# Patient Record
Sex: Female | Born: 1965 | Race: Black or African American | Hispanic: No | Marital: Married | Smoking: Never smoker
Health system: Southern US, Community
[De-identification: ages and names within clinical notes are randomized; demographics above are authoritative.]

## PROBLEM LIST (undated history)

## (undated) DIAGNOSIS — E669 Obesity, unspecified: Secondary | ICD-10-CM

## (undated) DIAGNOSIS — K5792 Diverticulitis of intestine, part unspecified, without perforation or abscess without bleeding: Secondary | ICD-10-CM

## (undated) DIAGNOSIS — R002 Palpitations: Secondary | ICD-10-CM

## (undated) DIAGNOSIS — I10 Essential (primary) hypertension: Secondary | ICD-10-CM

## (undated) DIAGNOSIS — E785 Hyperlipidemia, unspecified: Secondary | ICD-10-CM

## (undated) DIAGNOSIS — D573 Sickle-cell trait: Secondary | ICD-10-CM

## (undated) HISTORY — DX: Hyperlipidemia, unspecified: E78.5

## (undated) HISTORY — DX: Essential (primary) hypertension: I10

## (undated) HISTORY — PX: COLONOSCOPY: SHX174

## (undated) HISTORY — DX: Diverticulitis of intestine, part unspecified, without perforation or abscess without bleeding: K57.92

## (undated) HISTORY — PX: OTHER SURGICAL HISTORY: SHX169

## (undated) HISTORY — DX: Sickle-cell trait: D57.3

## (undated) HISTORY — DX: Obesity, unspecified: E66.9

## (undated) HISTORY — PX: POLYPECTOMY: SHX149

## (undated) HISTORY — DX: Palpitations: R00.2

---

## 1999-03-16 ENCOUNTER — Other Ambulatory Visit: Admission: RE | Admit: 1999-03-16 | Discharge: 1999-03-16 | Payer: Self-pay | Admitting: Obstetrics and Gynecology

## 1999-04-25 ENCOUNTER — Encounter: Payer: Self-pay | Admitting: Obstetrics and Gynecology

## 1999-04-25 ENCOUNTER — Ambulatory Visit (HOSPITAL_COMMUNITY): Admission: RE | Admit: 1999-04-25 | Discharge: 1999-04-25 | Payer: Self-pay | Admitting: Obstetrics and Gynecology

## 2000-04-10 ENCOUNTER — Other Ambulatory Visit: Admission: RE | Admit: 2000-04-10 | Discharge: 2000-04-10 | Payer: Self-pay | Admitting: Obstetrics and Gynecology

## 2001-02-11 ENCOUNTER — Other Ambulatory Visit: Admission: RE | Admit: 2001-02-11 | Discharge: 2001-02-11 | Payer: Self-pay | Admitting: Obstetrics and Gynecology

## 2002-03-04 ENCOUNTER — Other Ambulatory Visit: Admission: RE | Admit: 2002-03-04 | Discharge: 2002-03-04 | Payer: Self-pay | Admitting: Obstetrics and Gynecology

## 2003-04-28 ENCOUNTER — Emergency Department (HOSPITAL_COMMUNITY): Admission: AD | Admit: 2003-04-28 | Discharge: 2003-04-28 | Payer: Self-pay | Admitting: Emergency Medicine

## 2003-11-22 ENCOUNTER — Emergency Department (HOSPITAL_COMMUNITY): Admission: EM | Admit: 2003-11-22 | Discharge: 2003-11-23 | Payer: Self-pay | Admitting: Emergency Medicine

## 2004-06-29 ENCOUNTER — Other Ambulatory Visit: Admission: RE | Admit: 2004-06-29 | Discharge: 2004-06-29 | Payer: Self-pay | Admitting: Obstetrics & Gynecology

## 2004-07-21 ENCOUNTER — Ambulatory Visit (HOSPITAL_COMMUNITY): Admission: RE | Admit: 2004-07-21 | Discharge: 2004-07-21 | Payer: Self-pay | Admitting: Obstetrics & Gynecology

## 2006-06-17 ENCOUNTER — Ambulatory Visit: Payer: Self-pay | Admitting: Oncology

## 2006-09-17 ENCOUNTER — Ambulatory Visit: Payer: Self-pay | Admitting: Oncology

## 2007-03-10 ENCOUNTER — Ambulatory Visit (HOSPITAL_COMMUNITY): Admission: RE | Admit: 2007-03-10 | Discharge: 2007-03-10 | Payer: Self-pay | Admitting: Obstetrics & Gynecology

## 2007-06-16 ENCOUNTER — Ambulatory Visit (HOSPITAL_COMMUNITY): Admission: RE | Admit: 2007-06-16 | Discharge: 2007-06-16 | Payer: Self-pay | Admitting: Gynecology

## 2007-06-19 LAB — HM PAP SMEAR: HM Pap smear: NORMAL

## 2007-07-15 ENCOUNTER — Ambulatory Visit (HOSPITAL_COMMUNITY): Admission: RE | Admit: 2007-07-15 | Discharge: 2007-07-15 | Payer: Self-pay | Admitting: Gynecology

## 2007-10-03 ENCOUNTER — Ambulatory Visit: Payer: Self-pay | Admitting: Internal Medicine

## 2007-10-10 ENCOUNTER — Encounter: Payer: Self-pay | Admitting: Internal Medicine

## 2007-10-10 ENCOUNTER — Ambulatory Visit: Payer: Self-pay | Admitting: Internal Medicine

## 2008-04-26 ENCOUNTER — Ambulatory Visit (HOSPITAL_COMMUNITY): Admission: RE | Admit: 2008-04-26 | Discharge: 2008-04-26 | Payer: Self-pay | Admitting: Obstetrics and Gynecology

## 2009-01-11 ENCOUNTER — Emergency Department (HOSPITAL_COMMUNITY): Admission: EM | Admit: 2009-01-11 | Discharge: 2009-01-11 | Payer: Self-pay | Admitting: Family Medicine

## 2010-01-27 ENCOUNTER — Ambulatory Visit (HOSPITAL_COMMUNITY): Admission: RE | Admit: 2010-01-27 | Discharge: 2010-01-27 | Payer: Self-pay | Admitting: Obstetrics and Gynecology

## 2010-03-24 ENCOUNTER — Emergency Department (HOSPITAL_COMMUNITY): Admission: EM | Admit: 2010-03-24 | Discharge: 2010-03-24 | Payer: Self-pay | Admitting: Family Medicine

## 2010-06-14 ENCOUNTER — Emergency Department (HOSPITAL_COMMUNITY)
Admission: EM | Admit: 2010-06-14 | Discharge: 2010-06-14 | Payer: Self-pay | Source: Home / Self Care | Admitting: Family Medicine

## 2010-09-15 ENCOUNTER — Encounter (INDEPENDENT_AMBULATORY_CARE_PROVIDER_SITE_OTHER): Payer: Self-pay | Admitting: *Deleted

## 2010-09-19 NOTE — Letter (Signed)
Summary: Pre Visit Letter Revised  [facility]  [address] [place], [place] [address]   Phone: [phone]  Fax: [phone]        09/15/2010 MRN: [id number] [facility] [address] [place], [place]  [address]             Procedure Date:  10/27/2010 @ 2;00   Recall colon-Dr. Marina Goodell   Welcome to the Gastroenterology Division at [facility].    You are scheduled to see a nurse for your pre-procedure visit on 10/13/2010 at 8:30_ on the 3rd floor at [facility], [address]  We ask that you try to arrive at our office 15 minutes prior to your appointment time to allow for check-in.  Please take a minute to review the attached form.  If you answer "Yes" to one or more of the questions on the first page, we ask that you call the person listed at your earliest opportunity.  If you answer "No" to all of the questions, please complete the rest of the form and bring it to your appointment.    Your nurse visit will consist of discussing your medical and surgical history, your immediate family medical history, and your medications.   If you are unable to list all of your medications on the form, please bring the medication bottles to your appointment and we will list them.  We will need to be aware of both prescribed and over the counter drugs.  We will need to know exact dosage information as well.    Please be prepared to read and sign documents such as consent forms, a financial agreement, and acknowledgement forms.  If necessary, and with your consent, a friend or relative is welcome to sit-in on the nurse visit with you.  Please bring your insurance card so that we may make a copy of it.  If your insurance requires a referral to see a specialist, please bring your referral form from your primary care physician.  No co-pay is required for this nurse visit.     If you cannot keep your appointment, please call [phone] to cancel or reschedule prior  to your appointment date.  This allows [place] the opportunity to schedule an appointment for another patient in need of care.    Thank you for choosing [facility] for your medical needs.  We appreciate the opportunity to care for you.  Please visit [place] at our website  to learn more about our practice.  Sincerely, The Gastroenterology Division

## 2010-09-24 LAB — POCT PREGNANCY, URINE: Preg Test, Ur: NEGATIVE

## 2010-09-24 LAB — POCT I-STAT, CHEM 8
Calcium, Ion: 1.13 mmol/L (ref 1.12–1.32)
Chloride: 100 mEq/L (ref 96–112)
Glucose, Bld: 81 mg/dL (ref 70–99)

## 2010-10-13 ENCOUNTER — Ambulatory Visit (AMBULATORY_SURGERY_CENTER): Payer: Commercial Managed Care - PPO | Admitting: *Deleted

## 2010-10-13 ENCOUNTER — Encounter: Payer: Self-pay | Admitting: Internal Medicine

## 2010-10-13 DIAGNOSIS — Z800 Family history of malignant neoplasm of digestive organs: Secondary | ICD-10-CM

## 2010-10-13 DIAGNOSIS — Z860100 Personal history of colon polyps, unspecified: Secondary | ICD-10-CM

## 2010-10-13 MED ORDER — PEG-KCL-NACL-NASULF-NA ASC-C 100 G PO SOLR: ORAL | Status: DC

## 2010-10-27 ENCOUNTER — Ambulatory Visit (AMBULATORY_SURGERY_CENTER): Payer: Commercial Managed Care - PPO | Admitting: Internal Medicine

## 2010-10-27 ENCOUNTER — Encounter: Payer: Self-pay | Admitting: Internal Medicine

## 2010-10-27 VITALS — BP 149/69 | HR 92 | Temp 99.1°F | Resp 22 | Ht 65.0 in | Wt 280.0 lb

## 2010-10-27 DIAGNOSIS — Z86010 Personal history of colonic polyps: Secondary | ICD-10-CM

## 2010-10-27 DIAGNOSIS — Z800 Family history of malignant neoplasm of digestive organs: Secondary | ICD-10-CM

## 2010-10-27 DIAGNOSIS — Z1211 Encounter for screening for malignant neoplasm of colon: Secondary | ICD-10-CM

## 2010-10-27 DIAGNOSIS — K5730 Diverticulosis of large intestine without perforation or abscess without bleeding: Secondary | ICD-10-CM

## 2010-10-27 LAB — HM COLONOSCOPY

## 2010-10-27 MED ORDER — SODIUM CHLORIDE 0.9 % IV SOLN: 500.0000 mL | INTRAVENOUS | Status: DC

## 2010-10-27 NOTE — Patient Instructions (Signed)
Please follow discharge instructions given today. Diverticulosis seen,handouts given on that and high fiber diet. Repeat colonoscopy in 5 years. Continue current medications.

## 2010-10-30 ENCOUNTER — Telehealth: Payer: Self-pay | Admitting: *Deleted

## 2010-10-30 NOTE — Telephone Encounter (Signed)
Attempted home number which is the same as the mobile number and there was no identification on the number voice mail so no message left-em,rn

## 2011-03-10 ENCOUNTER — Emergency Department (HOSPITAL_COMMUNITY): Payer: 59

## 2011-03-10 ENCOUNTER — Emergency Department (HOSPITAL_COMMUNITY)
Admission: EM | Admit: 2011-03-10 | Discharge: 2011-03-10 | Disposition: A | Discharge status: Home / Self Care | Payer: 59 | Attending: Emergency Medicine | Admitting: Emergency Medicine

## 2011-03-10 DIAGNOSIS — T5491XA Toxic effect of unspecified corrosive substance, accidental (unintentional), initial encounter: Secondary | ICD-10-CM | POA: Insufficient documentation

## 2011-03-10 DIAGNOSIS — I10 Essential (primary) hypertension: Secondary | ICD-10-CM | POA: Insufficient documentation

## 2011-03-10 DIAGNOSIS — R0602 Shortness of breath: Secondary | ICD-10-CM | POA: Insufficient documentation

## 2011-03-10 DIAGNOSIS — T541X1A Toxic effect of other corrosive organic compounds, accidental (unintentional), initial encounter: Secondary | ICD-10-CM | POA: Insufficient documentation

## 2011-07-18 ENCOUNTER — Encounter: Payer: 59 | Attending: Family Medicine | Admitting: *Deleted

## 2011-07-18 ENCOUNTER — Encounter: Payer: Self-pay | Admitting: *Deleted

## 2011-07-18 NOTE — Progress Notes (Signed)
  Patient was seen on 07/18/2011 for the first of a series of three diabetes self-management courses at [facility]. The following learning objectives were met by the patient during this course:   Defines the role of glucose and insulin  Identifies type of diabetes and pathophysiology  Defines the diagnostic criteria for diabetes and prediabetes  States the risk factors for Type 2 Diabetes  States the symptoms of Type 2 Diabetes  Defines Type 2 Diabetes treatment goals  Defines Type 2 Diabetes treatment options  States the rationale for glucose monitoring  Identifies A1C, glucose targets, and testing times  Identifies proper sharps disposal  Defines the purpose of a diabetes food plan  Identifies carbohydrate food groups  Defines effects of carbohydrate foods on glucose levels  Identifies carbohydrate choices/grams/food labels  States benefits of physical activity and effect on glucose  Review of suggested activity guidelines  Last A1c: 8.2 (05/20/12)  Handouts given during class include:  Type 2 Diabetes: Basics Book  My Food Plan Book  Food and Activity Log  Patient has established the following initial goals:  Pt did not establish any goals at visit.  Follow-Up Plan: Pt to attend Core Diabetes Education classes II and III in March 2013 as scheduled.

## 2011-07-18 NOTE — Patient Instructions (Signed)
Patient will attend Core Diabetes Courses II and III as scheduled or follow up prn.  

## 2011-07-31 IMAGING — CR DG HAND COMPLETE 3+V*R*
3 series · 3 of 3 positions shown · non-contrast
Comparison: None.

CLINICAL DATA: Hand and arm pain, no acute trauma

RIGHT HAND - COMPLETE 3+ VIEW

[view not recorded (1 of 3)]
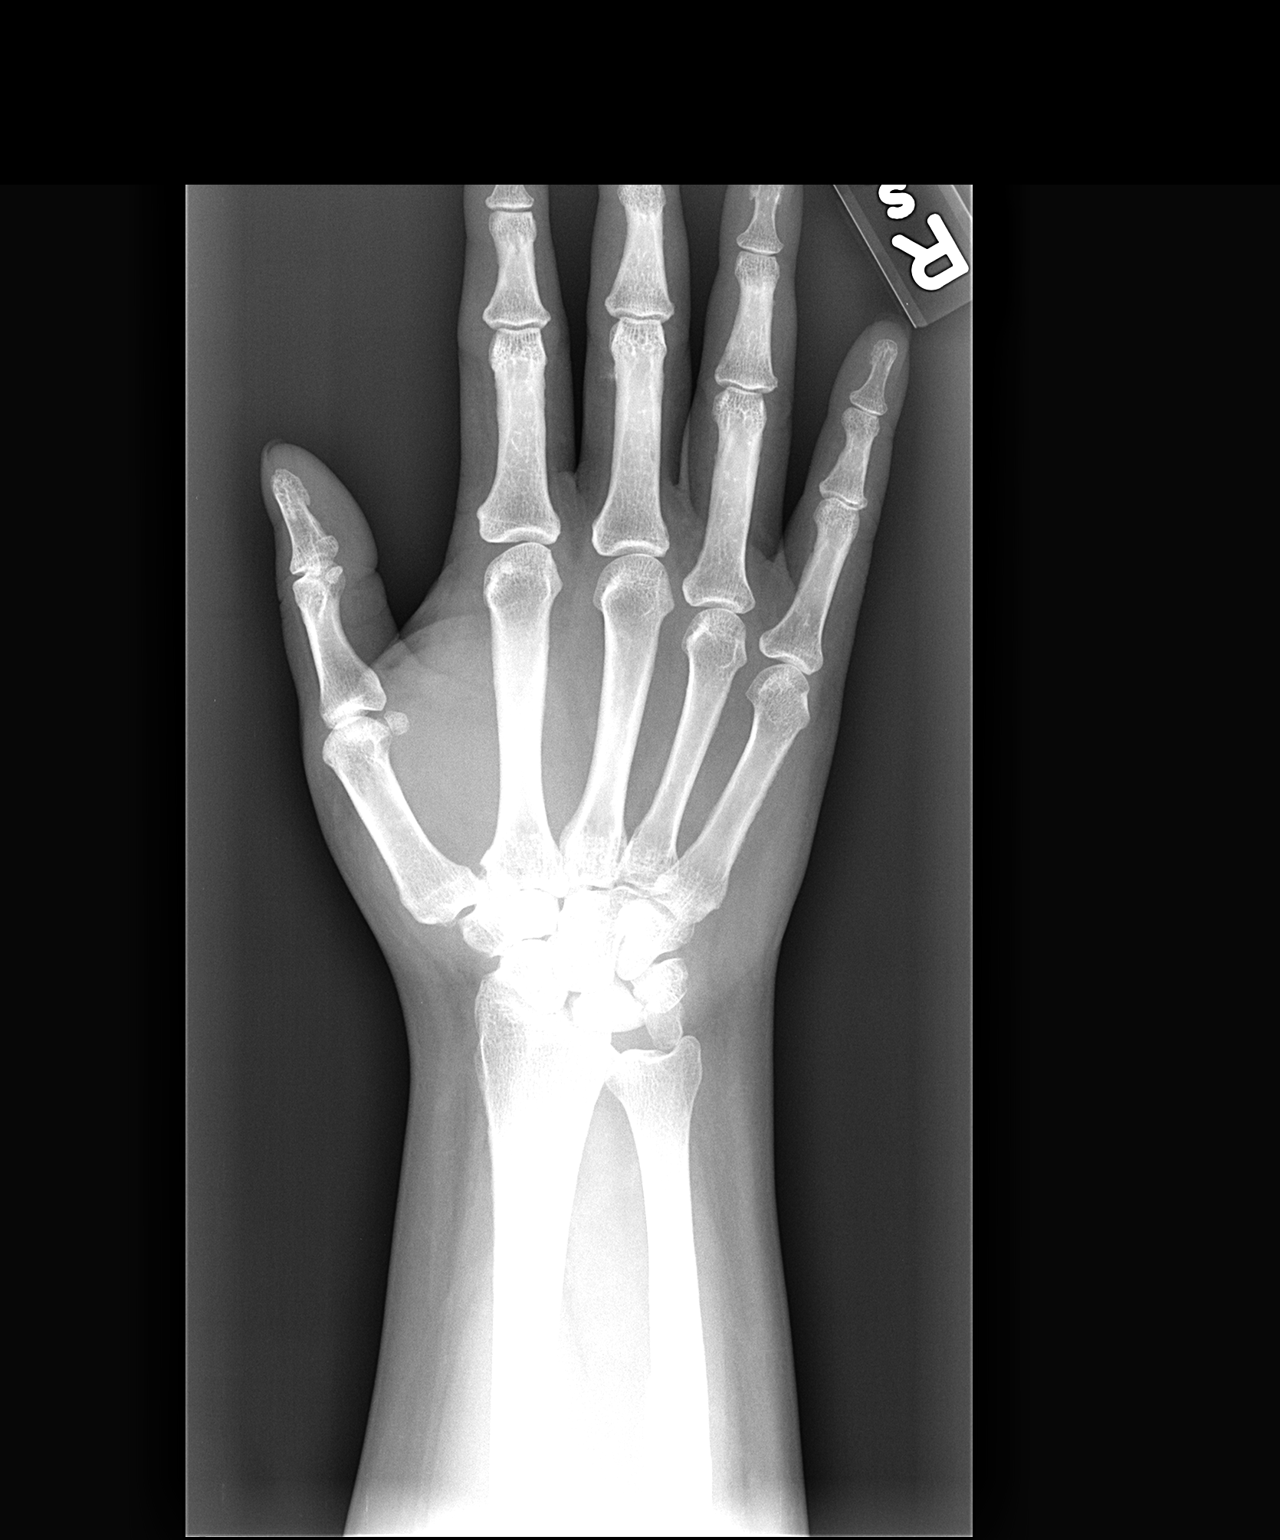

[view not recorded (2 of 3)]
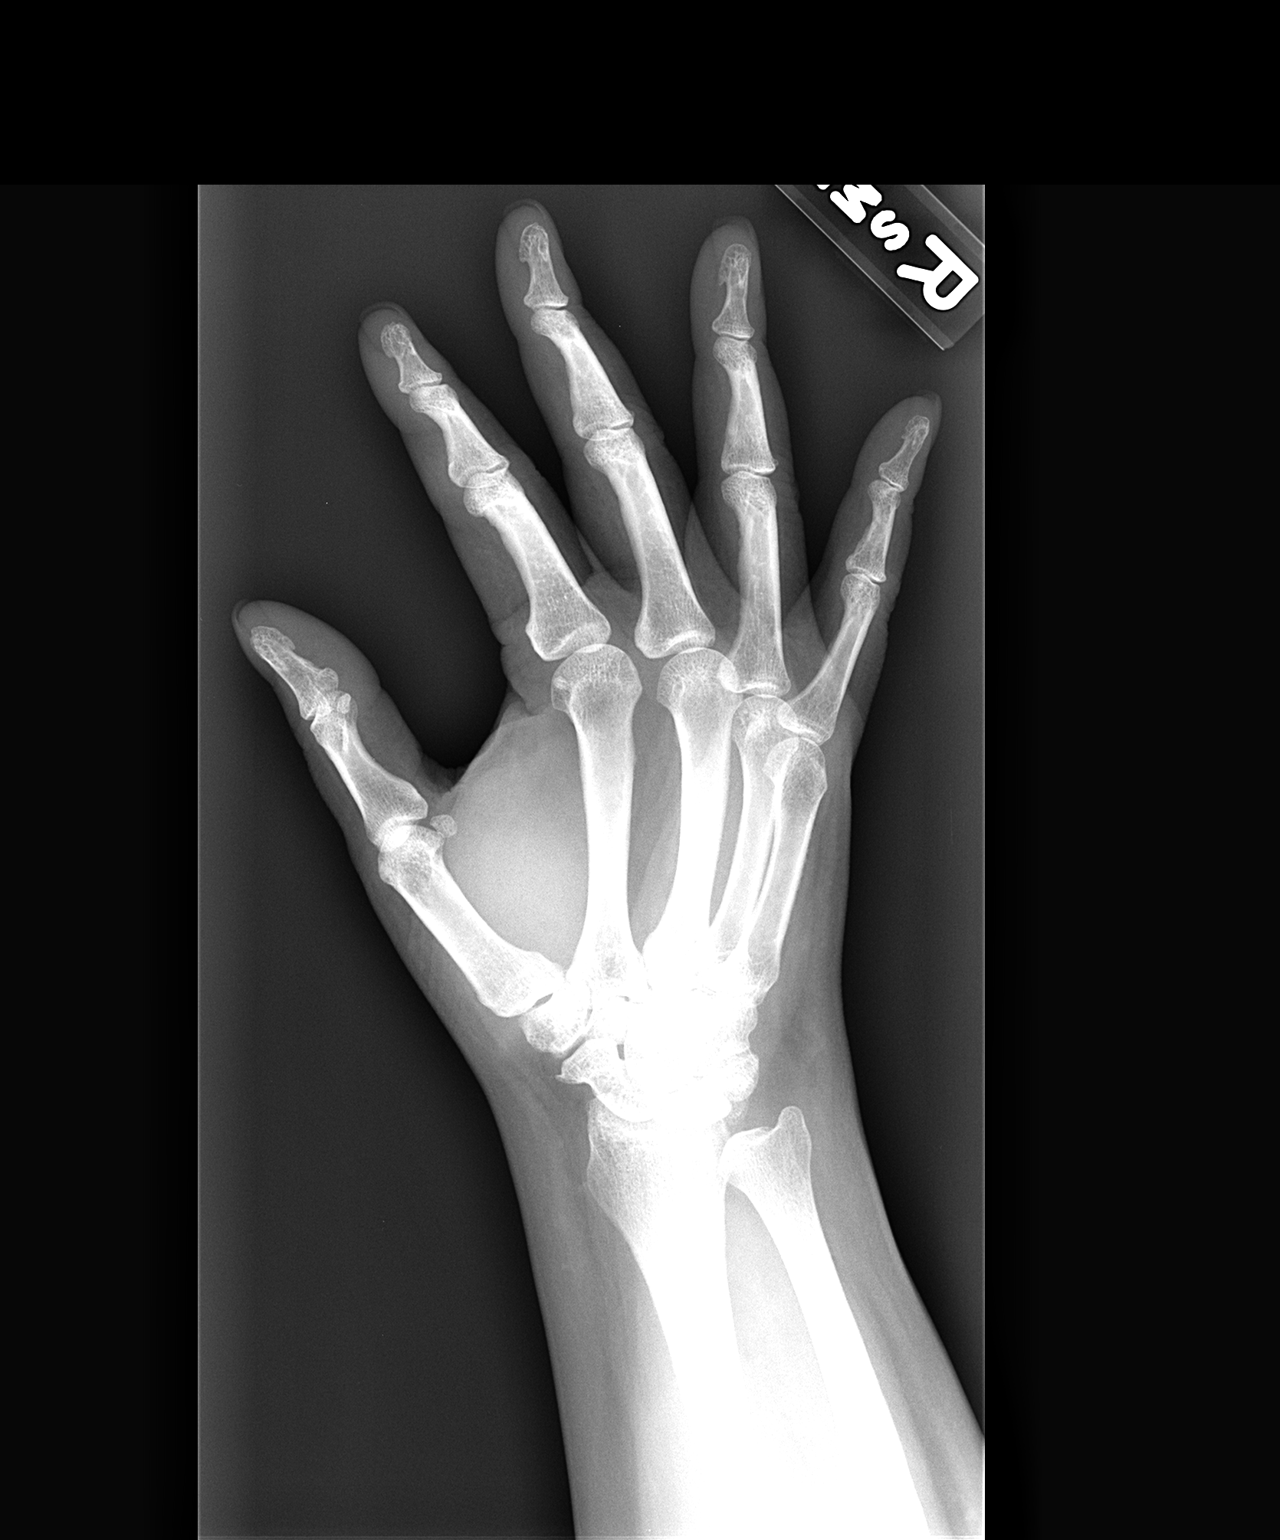

[view not recorded (3 of 3)]
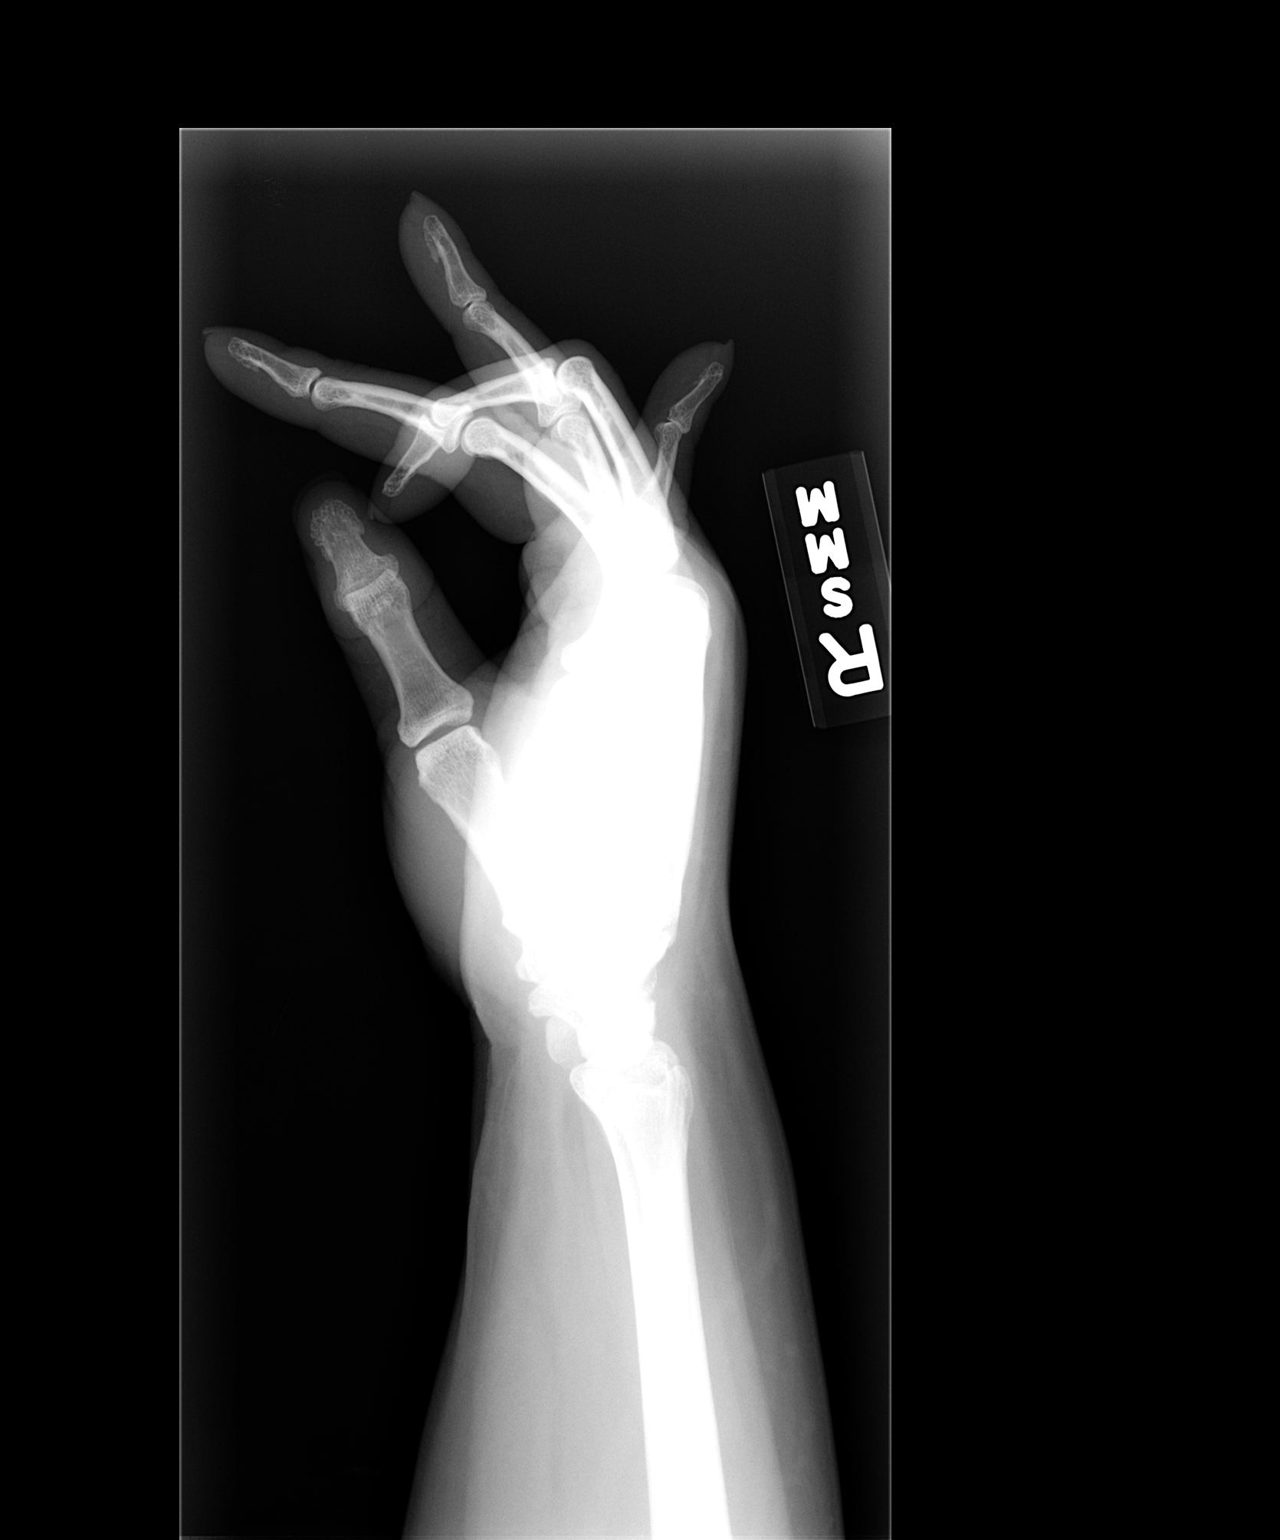

[3 of 3 positions shown; findings below may reference images not displayed]

FINDINGS: The radiocarpal joint space appears normal.  The pisiform
bone is slightly proximal overlying the ulnar styloid on the
frontal view but this is most likely a normal variation.  The MCP,
PIP, and DIP joints appear normal with no acute abnormality noted.
No erosions are seen.
IMPRESSION: No acute abnormality.  No erosions.

## 2011-08-28 ENCOUNTER — Ambulatory Visit: Payer: 59

## 2011-09-04 ENCOUNTER — Ambulatory Visit: Payer: 59

## 2011-09-22 ENCOUNTER — Encounter: Payer: Self-pay | Admitting: Dietician

## 2011-09-22 ENCOUNTER — Encounter: Payer: 59 | Attending: Family Medicine | Admitting: Dietician

## 2011-09-22 NOTE — Progress Notes (Signed)
Patient was seen on 09/22/2011 for the complete series of three diabetes self-management courses at the [facility]. The following learning objectives were met by the patient during this course:  Core 1:   Gain an understanding of diabetes and what causes it  Learn how diabetes is treated and the goals of treatment  Learn why, how and when to test BG  Learn how carbohydrate affects your glucose  Receive a personal food plan and learn how to count carbohydrates  Discover how physical activity enhances glucose control and overall health  Begin to gain confidence in your ability to manage diabetes  Handouts given during this class include:  Type 2 Diabetes: Basics Book  My Food Plan Book  Food and Activity Log  Core 2:   Describe causes, symptoms and treatment of hypoglycemia and hyperglycemia  Learn how to care for your glucose meter and strips  Explain how to manage diabetes during illness  List strategies to follow meal plan when dining out  Describe the effects of alcohol on glucose and how to use it safely  Describe problem solving skills for day-to-day glucose challenges  Describe ways to remain physically active  Describe the impact of regular activity on insulin resistance  Handouts given in this class:  Refrigerator magnet for Sick Day Guidelines  [facility] Oral Medication and Insulin handout  Core 3   Describe how diabetes changes over time  Understand why glucose may be out of target  Learn how diabetes changes over time  Learn about blood pressure, cholesterol, and heart health  Learn about lowering dietary fat and sodium  Understand the benefits of physical activity for heart health  Develop problem solving skills for times when glucose numbers are puzzling  Develop strategies for creating life balance  Learn how to identify if your treatment plan needs to change  Develop strategies for dealing with stress,  depression and staying motivated  Identify healthy weight-loss plans  Gain confidence that you can succeed in caring for your diabetes  Establish 2-3 goals that they will plan to diligently work on until they return                   for the free 29-month follow-up visit   The following handouts were given in class:  3 Month Follow Up Visit handout  Goal setting handout  Class evaluation form  Your patient has established the following 3 month goals for diabetes self-care:  Increase my activity to at least 3 days a week  Take my diabetes medicines as scheduled  Look for patterns in my record book at least 2 days a month  Count carbohydrates at most meals and snacks.  I will workout at least 3 times a week.  I will continue until I increase workout 4 X/ week.  Follow-Up Plan: Patient was offered a 3 month follow-up visit for diabetes self-management education.

## 2011-09-26 NOTE — Progress Notes (Deleted)
Patient was seen on 09/22/2011 for the complete series of three diabetes self-management courses at the [facility]. The following learning objectives were met by the patient during this course:  Core 1:   Gain an understanding of diabetes and what causes it  Learn how diabetes is treated and the goals of treatment  Learn why, how and when to test BG  Learn how carbohydrate affects your glucose  Receive a personal food plan and learn how to count carbohydrates  Discover how physical activity enhances glucose control and overall health  Begin to gain confidence in your ability to manage diabetes  Handouts given during this class include:  Type 2 Diabetes: Basics Book  My Food Plan Book  Food and Activity Log  Core 2:   Describe causes, symptoms and treatment of hypoglycemia and hyperglycemia  Learn how to care for your glucose meter and strips  Explain how to manage diabetes during illness  List strategies to follow meal plan when dining out  Describe the effects of alcohol on glucose and how to use it safely  Describe problem solving skills for day-to-day glucose challenges  Describe ways to remain physically active  Describe the impact of regular activity on insulin resistance  Handouts given in this class:  Refrigerator magnet for Sick Day Guidelines  [facility] Oral Medication and Insulin handout  Core 3   Describe how diabetes changes over time  Understand why glucose Melissa Cuevas be out of target  Learn how diabetes changes over time  Learn about blood pressure, cholesterol, and heart health  Learn about lowering dietary fat and sodium  Understand the benefits of physical activity for heart health  Develop problem solving skills for times when glucose numbers are                         puzzling  Develop strategies for creating life balance  Learn how to identify if your treatment plan needs to change  Develop strategies for  dealing with stress, depression and staying                                     motivated  Identify healthy weight-loss plans  Gain confidence that you can succeed in caring for your diabetes  Establish 2-3 goals that they will plan to diligently work on until they return                   for the free 25-month follow-up visit    The following handouts were given in class:  3 Month Follow Up Visit handout  Goal setting handout  Class evaluation form  Your patient has established the following 3 month goals for diabetes self-care:  Increase my activity at least 3 days a week  Look for patterns in my glucose record book at least 2 days a month  To help my stress, I will workout at least 3 times per week.  I will continue until increase the workout to 4X/deek.  Follow-Up Plan: Patient was offered a 3 month follow-up visit for diabetes self-management education.

## 2011-11-01 ENCOUNTER — Ambulatory Visit: Payer: Commercial Managed Care - PPO

## 2012-01-02 ENCOUNTER — Ambulatory Visit: Payer: 59 | Admitting: *Deleted

## 2012-03-24 ENCOUNTER — Telehealth: Payer: Self-pay | Admitting: Internal Medicine

## 2012-03-24 ENCOUNTER — Other Ambulatory Visit (HOSPITAL_COMMUNITY): Payer: Self-pay | Admitting: Physician Assistant

## 2012-03-24 DIAGNOSIS — Z1231 Encounter for screening mammogram for malignant neoplasm of breast: Secondary | ICD-10-CM

## 2012-03-24 NOTE — Telephone Encounter (Signed)
Left message for pt to call back  °

## 2012-03-25 NOTE — Telephone Encounter (Signed)
Left message for pt to call back  °

## 2012-03-28 NOTE — Telephone Encounter (Signed)
Left message for pt to call back. Have been unable to reach patient.

## 2012-04-03 ENCOUNTER — Telehealth: Payer: Self-pay

## 2012-04-03 NOTE — Telephone Encounter (Signed)
I don't have the original the information was obtained from the report in EPIC from 10/27/10

## 2012-04-03 NOTE — Telephone Encounter (Signed)
Put the original colonoscopy report on my desk for review (not in EPIC). Also, mom's age at time of colon cancer dx? Thanks

## 2012-04-03 NOTE — Telephone Encounter (Signed)
Patient would like Dr. Marina Goodell to please review her chart for a colonoscopy recall.  Patient has a family history of colon cancer in her mother.  She has a personal history of adenomatous colon polyps initially in 2009. Her last colonoscopy 10/27/10 showed diverticulosis and you recommended a 5 year recall.  She feels that she needs a colon every year at the most every 2 years.  She has discussed with her insurance company and they state that it is a preventative test and covered annually if performed.  Dr. Marina Goodell please review and advise if ok to schedule colon.

## 2012-04-03 NOTE — Telephone Encounter (Signed)
Left message for patient to call back  

## 2012-04-04 NOTE — Telephone Encounter (Signed)
Left message for patient to call back  

## 2012-04-08 NOTE — Telephone Encounter (Signed)
Left message for patient to call back  

## 2012-04-09 ENCOUNTER — Ambulatory Visit (HOSPITAL_COMMUNITY)
Admission: RE | Admit: 2012-04-09 | Discharge: 2012-04-09 | Disposition: A | Discharge status: Home / Self Care | Payer: 59 | Source: Ambulatory Visit | Attending: Physician Assistant | Admitting: Physician Assistant

## 2012-04-09 DIAGNOSIS — Z1231 Encounter for screening mammogram for malignant neoplasm of breast: Secondary | ICD-10-CM | POA: Insufficient documentation

## 2012-04-09 NOTE — Telephone Encounter (Signed)
No return call from the patient . I have left a message for the patient to call back if she still wants to pursue an earlier colonoscopy.

## 2012-07-09 ENCOUNTER — Ambulatory Visit (INDEPENDENT_AMBULATORY_CARE_PROVIDER_SITE_OTHER): Payer: Self-pay | Admitting: Family Medicine

## 2012-07-09 DIAGNOSIS — E119 Type 2 diabetes mellitus without complications: Secondary | ICD-10-CM

## 2012-07-10 NOTE — Progress Notes (Signed)
Patient presents for 3 month follow up of DM as part of the employee sponsored Link to [employer]. Medications and glucose readings have been reviewed.Lifestyle interventions such as diet and exercise have also been discussed. Full description of this visit can be seen in [facility] documenting program through [employer] [facility]). Patient has set a series of personal goals and will follow up in 3 months.

## 2012-07-14 ENCOUNTER — Encounter: Payer: Self-pay | Admitting: Family Medicine

## 2012-07-14 NOTE — Progress Notes (Signed)
Patient ID: Melissa Cuevas, female   DOB: 1965-08-21, 47 y.o.   MRN: [id number] Reviewed: Agree with documentation and management.

## 2012-09-24 ENCOUNTER — Other Ambulatory Visit: Payer: Self-pay | Admitting: Occupational Medicine

## 2012-09-24 ENCOUNTER — Ambulatory Visit: Payer: Self-pay

## 2012-09-24 DIAGNOSIS — R7612 Nonspecific reaction to cell mediated immunity measurement of gamma interferon antigen response without active tuberculosis: Secondary | ICD-10-CM

## 2013-03-05 ENCOUNTER — Ambulatory Visit (INDEPENDENT_AMBULATORY_CARE_PROVIDER_SITE_OTHER): Payer: 59 | Admitting: Family Medicine

## 2013-03-05 VITALS — BP 159/89 | HR 89 | Wt 249.0 lb

## 2013-03-05 NOTE — Progress Notes (Signed)
Patient presents to pharmacy for 3 month f/u DM as part of the employee sponsored Link to [employer]. Medications have been reviewed. I have also discussed with patient lifestyle interventions such as diet and exercise. Full documentation of this visit can be found in the [employer] documenting system through [employer] [facility]). However specifics from this visit include the following:  POC A1C 6.4, at goal on metformin. No recommended changes.  Hyperlipidemia: LDL extremely high but won't take a statin bc patient about to go through in vitro. Has been trying to conceive for a long time.  Hypertension:  BP also high when I check it. She said it's normal at her doctor's office. In the past, it's been a compliance issue. I'm going to fax her doctor and see if they have getting high readings and see if they want to do anything about it.    Patient has set a series of personal goals and will f/u in 3 mo for further review of DM.

## 2013-06-09 DIAGNOSIS — E119 Type 2 diabetes mellitus without complications: Secondary | ICD-10-CM | POA: Insufficient documentation

## 2013-06-09 NOTE — Progress Notes (Signed)
Patient ID: Melissa Cuevas, female   DOB: 09/28/1965, 47 y.o.   MRN: [id number] ATTENDING PHYSICIAN NOTE: I have reviewed the chart and agree with the plan as detailed above. Daveon Arpino MD Pager [phone] 

## 2013-08-06 ENCOUNTER — Ambulatory Visit (INDEPENDENT_AMBULATORY_CARE_PROVIDER_SITE_OTHER): Payer: 59 | Admitting: Family Medicine

## 2013-08-06 VITALS — BP 180/101 | HR 86 | Wt 259.0 lb

## 2013-08-06 DIAGNOSIS — E119 Type 2 diabetes mellitus without complications: Secondary | ICD-10-CM

## 2013-08-06 NOTE — Progress Notes (Signed)
Patient presents for 3 mo f/u DM as part of the employee sponsored Link to [employer]. Medications have been reviewed. I have also dicussed with patient lifestyle interventions such as diet and exercise. Full documentation of this visit can be found in the [employer] documenting system through [facility] However specifics from this visit include the following:   Diabetes Mellitus:POC A1C 5.7, at goal. No recommended changes to diabetes regimen. Her metformin was 36 days past due so I filled it today.  Hyperlipidemia: doesn't want to take cholesterol medication because she is still trying to get pregnant at age 48 (still having cycles). Had long discussion with patient on how her LDL is >190 and how she and her husband need to decide long term plans for her health. She really needs to be on a statin.  Hypertension: BP elevated today at 180/101. This is a recurring issue for her. She is on labetalol because she is trying to still get pregnant. She also takes amlodipine, however, it was 66 days past due for being filled. The labetalol was 43 days past due. She said she has trouble with bid dosing of the labetalol and frequently misses it. Discussed options of metoprolol XL or HCTZ which would be once daily dosing. I told her whenever she and her husband discuss pregnancy/adoption plans, we need to optimize her BP medication regimen as soon as possible. Had long discussion on long term effects of improperly treated HTN and urged patient finalize plans so we can move forward with treatment. The main issue is she isn't taking her medications properly.  Cost Savings Intervention Outcomes: Supplies provided, proactive intervention  Patient has set a series of personal goals and will f/u in 3 mo for further review of DM

## 2013-10-08 NOTE — Progress Notes (Signed)
Patient ID: Melissa Cuevas, female   DOB: 02/08/1966, 48 y.o.   MRN: [id number] ATTENDING PHYSICIAN NOTE: I have reviewed the chart and agree with the plan as detailed above. Denny LevySara Neal MD Pager [phone]

## 2013-12-04 ENCOUNTER — Ambulatory Visit (INDEPENDENT_AMBULATORY_CARE_PROVIDER_SITE_OTHER): Payer: Self-pay | Admitting: Family Medicine

## 2013-12-04 DIAGNOSIS — E119 Type 2 diabetes mellitus without complications: Secondary | ICD-10-CM

## 2013-12-04 NOTE — Progress Notes (Signed)
Patient reports for 3 mo f/u DM as part of the employee sponsored Link to [employer] program. Medications have been reviewed. I have also discussed with patient lifestyle interventions such as diet and exercise. Full documentation of this visit can be found in the [employer]tracker documenting system through [facility] However specifics from this visit include the following:   Diabetes Mellitus: A1C from Dr. Lavena Stanfordedmon's office in March was 5.6, at goal. Patient lost meter but says she has another one. Told her if she needs test strips to let me know which meter it is and I will get her a prescription for it. f/u 3 mo  Hyperlipidemia: Other  LDL >190. Patient is now ready to start a cholesterol medicine. plan 1.) fax md for crestor 40 mg Rx 2.) I will put a coupon on the Rx when it comes in to make it $18 Hypertension:  BP still elevated. Patient extremely non compliant. Frequently misses second dose of labetalol. Her BP meds are always past due to be filled. I looked at her labs from MD office and she her MALB shows she is spilling protein in her urine. plan 1.) filled amlodipine today. 2.) labetalol is to difficult for patient to remember to take beceause of bid dosing. Because she is spilling protein, she needs an ACEI or ARB. Will fax md to swap labetalol for lisinopril  Patient has set a series of personal goals and will f/u in 3 mo for further review of DM

## 2013-12-15 LAB — HM DIABETES EYE EXAM

## 2014-01-18 ENCOUNTER — Encounter: Payer: Self-pay | Admitting: Family Medicine

## 2014-01-18 NOTE — Progress Notes (Signed)
Patient ID: Melissa Cuevas, female   DOB: 03/18/1966, 48 y.o.   MRN: [id number] Reviewed: Agree with our Pharmacologist's documentation and management.

## 2014-01-31 IMAGING — CR DG CHEST 1V
1 series · 1 of 1 positions shown · non-contrast
Comparison: Chest radiograph 03/10/2011

CLINICAL DATA: Positive QFTG

CHEST - 1 VIEW

[view not recorded]
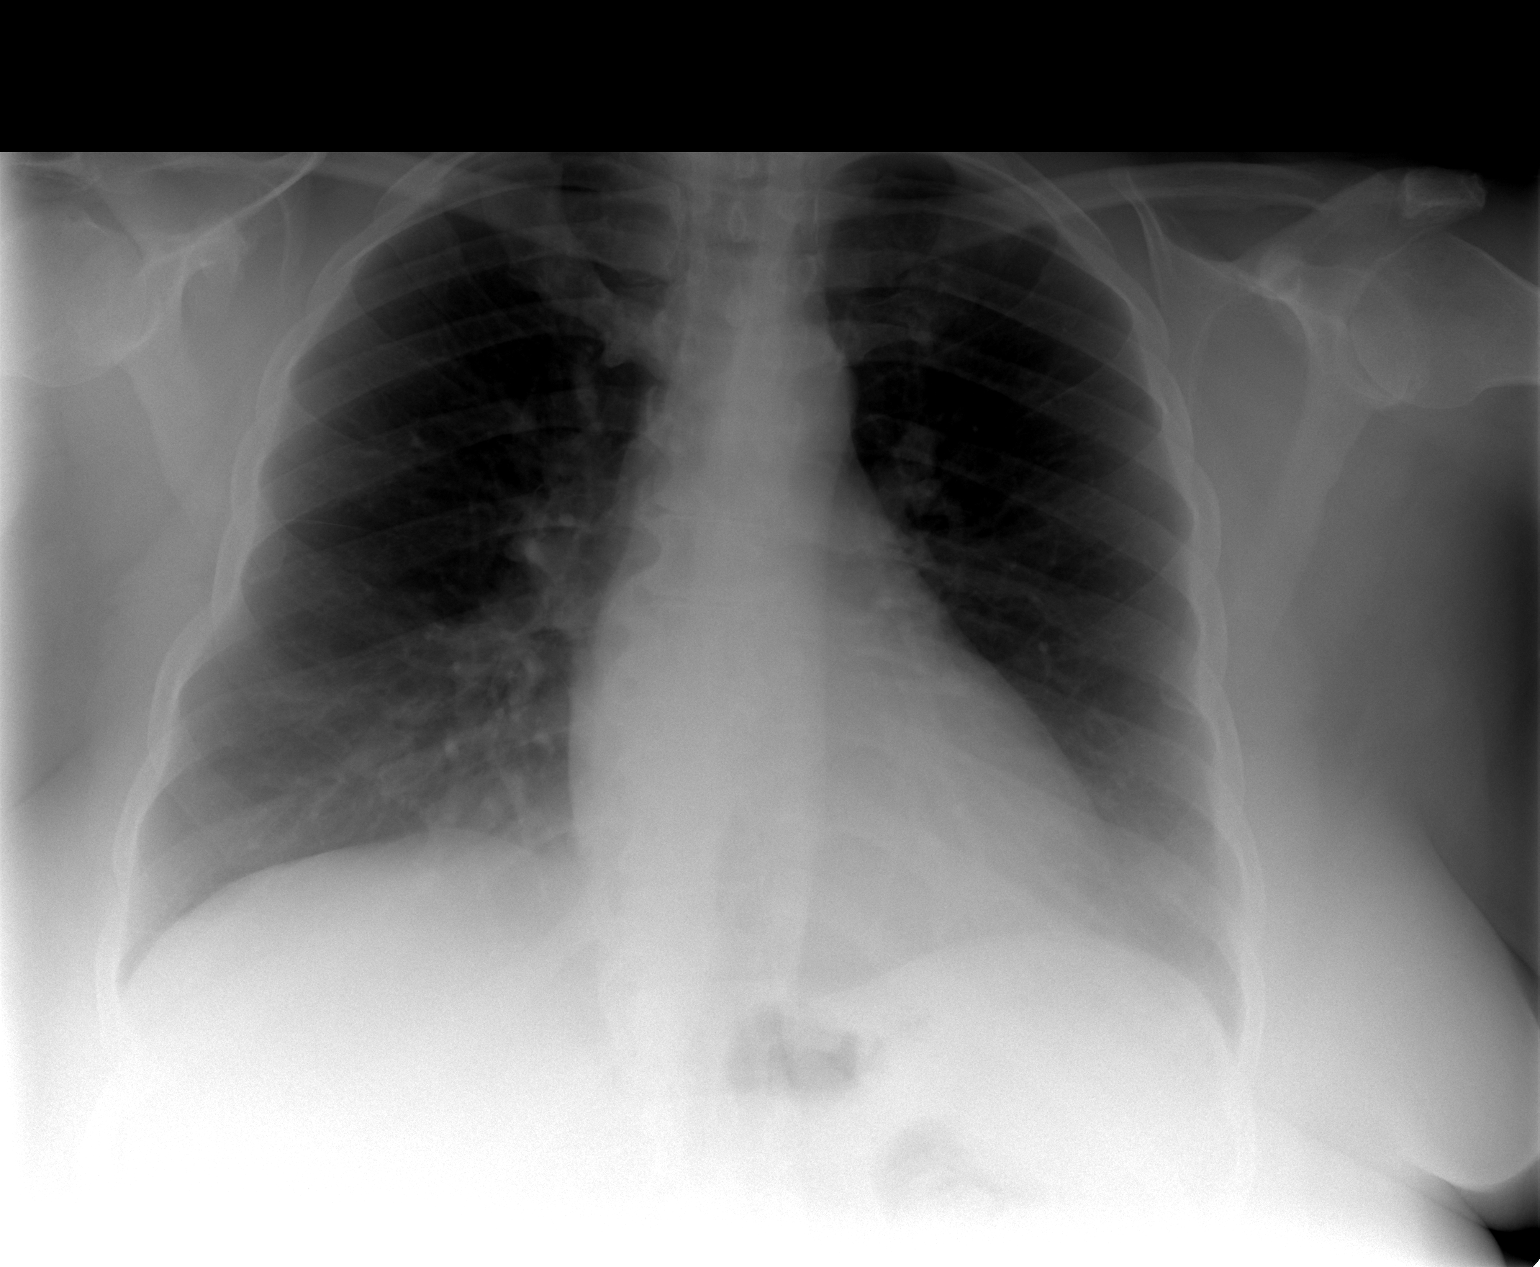

[1 of 1 positions shown; findings below may reference images not displayed]

FINDINGS: Heart size appears upper normal for portable technique.
The mediastinal and hilar contours are normal.  No evidence of
lymphadenopathy.  Trachea is midline.  The lungs are clear.  No
airspace disease, scarring, or pleural effusion.  No acute osseous
abnormality.
IMPRESSION: No acute cardiopulmonary disease.  No radiographic evidence of
pulmonary tuberculosis.

## 2014-02-01 ENCOUNTER — Telehealth: Payer: Self-pay | Admitting: Internal Medicine

## 2014-02-01 ENCOUNTER — Other Ambulatory Visit (HOSPITAL_COMMUNITY): Payer: Self-pay | Admitting: Physician Assistant

## 2014-02-01 DIAGNOSIS — Z1231 Encounter for screening mammogram for malignant neoplasm of breast: Secondary | ICD-10-CM

## 2014-02-01 NOTE — Telephone Encounter (Signed)
Given her early age of adenomatous polyps, and change in family history, Okay to schedule colonoscopy.

## 2014-02-01 NOTE — Telephone Encounter (Signed)
Per Dr. Marina GoodellPerry ok to go ahead and schedule colon for pt. Please schedule colon.

## 2014-02-01 NOTE — Telephone Encounter (Signed)
Pts recall colon is due in 2017. States her mother was diagnosed with colon cancer at the age of 50, she has since passed away. Pt states she called her insurance company and knows now that she can have the colon done sooner due to family history. Pt wants to go ahead and schedule her colon. Please advise.

## 2014-02-03 ENCOUNTER — Encounter: Payer: Self-pay | Admitting: Internal Medicine

## 2014-02-03 ENCOUNTER — Telehealth: Payer: Self-pay

## 2014-02-03 NOTE — Telephone Encounter (Signed)
Left message for patient regarding the questions about her rx.

## 2014-02-03 NOTE — Telephone Encounter (Signed)
Message copied by Jeanine LuzWRENN, Talulah Schirmer K on Wed Feb 03, 2014  2:53 PM ------      Message from: Mckinley JewelHAZELWOOD, AMY L      Created: Wed Feb 03, 2014  1:46 PM       Please call this pt about refill on medication.  Refill requested and wants to speak about another. Please call her before you get refill auth.      Thanks Amy ------

## 2014-02-04 NOTE — Telephone Encounter (Signed)
Pt scheduled  

## 2014-02-11 ENCOUNTER — Ambulatory Visit (HOSPITAL_COMMUNITY)
Admission: RE | Admit: 2014-02-11 | Discharge: 2014-02-11 | Disposition: A | Discharge status: Home / Self Care | Payer: 59 | Source: Ambulatory Visit | Attending: Physician Assistant | Admitting: Physician Assistant

## 2014-02-11 DIAGNOSIS — Z1231 Encounter for screening mammogram for malignant neoplasm of breast: Secondary | ICD-10-CM | POA: Insufficient documentation

## 2014-02-11 LAB — HM MAMMOGRAPHY

## 2014-03-18 ENCOUNTER — Ambulatory Visit: Payer: 59 | Admitting: *Deleted

## 2014-03-18 VITALS — Ht 65.0 in | Wt 260.4 lb

## 2014-03-18 DIAGNOSIS — Z800 Family history of malignant neoplasm of digestive organs: Secondary | ICD-10-CM

## 2014-03-18 MED ORDER — MOVIPREP 100 G PO SOLR: ORAL | Status: DC

## 2014-03-18 NOTE — Progress Notes (Signed)
No allergies to eggs or soy. No prior anesthesia; no problems with sedation  Pt given Emmi instructions for colonoscopy  No oxygen use  No diet drug use  

## 2014-04-02 ENCOUNTER — Encounter: Payer: Self-pay | Admitting: Internal Medicine

## 2014-04-02 ENCOUNTER — Telehealth: Payer: Self-pay | Admitting: Internal Medicine

## 2014-04-02 ENCOUNTER — Ambulatory Visit (AMBULATORY_SURGERY_CENTER): Payer: 59 | Admitting: Internal Medicine

## 2014-04-02 VITALS — BP 199/97 | HR 83 | Temp 97.8°F | Resp 21 | Ht 65.0 in | Wt 260.0 lb

## 2014-04-02 DIAGNOSIS — Z86010 Personal history of colonic polyps: Secondary | ICD-10-CM

## 2014-04-02 DIAGNOSIS — Z800 Family history of malignant neoplasm of digestive organs: Secondary | ICD-10-CM

## 2014-04-02 LAB — GLUCOSE, CAPILLARY
Glucose-Capillary: 84 mg/dL (ref 70–99)
Glucose-Capillary: 74 mg/dL (ref 70–99)

## 2014-04-02 MED ORDER — SODIUM CHLORIDE 0.9 % IV SOLN: 500.0000 mL | INTRAVENOUS | Status: DC

## 2014-04-02 NOTE — Patient Instructions (Signed)
YOU HAD AN ENDOSCOPIC PROCEDURE TODAY AT THE [facility]: Refer to the procedure report that was given to you for any specific questions about what was found during the examination.  If the procedure report does not answer your questions, please call your gastroenterologist to clarify.  If you requested that your care partner not be given the details of your procedure findings, then the procedure report has been included in a sealed envelope for you to review at your convenience later.  YOU SHOULD EXPECT: Some feelings of bloating in the abdomen. Passage of more gas than usual.  Walking can help get rid of the air that was put into your GI tract during the procedure and reduce the bloating. If you had a lower endoscopy (such as a colonoscopy or flexible sigmoidoscopy) you may notice spotting of blood in your stool or on the toilet paper. If you underwent a bowel prep for your procedure, then you may not have a normal bowel movement for a few days.  DIET: Your first meal following the procedure should be a light meal and then it is ok to progress to your normal diet.  A half-sandwich or bowl of soup is an example of a good first meal.  Heavy or fried foods are harder to digest and may make you feel nauseous or bloated.  Likewise meals heavy in dairy and vegetables can cause extra gas to form and this can also increase the bloating.  Drink plenty of fluids but you should avoid alcoholic beverages for 24 hours. Try to increase the fiber in your diet.  ACTIVITY: Your care partner should take you home directly after the procedure.  You should plan to take it easy, moving slowly for the rest of the day.  You can resume normal activity the day after the procedure however you should NOT DRIVE or use heavy machinery for 24 hours (because of the sedation medicines used during the test).    SYMPTOMS TO REPORT IMMEDIATELY: A gastroenterologist can be reached at any hour.  During normal business hours, 8:30  AM to 5:00 PM Monday through Friday, call [phone].  After hours and on weekends, please call the GI answering service at [phone] who will take a message and have the physician on call contact you.   Following lower endoscopy (colonoscopy or flexible sigmoidoscopy):  Excessive amounts of blood in the stool  Significant tenderness or worsening of abdominal pains  Swelling of the abdomen that is new, acute  Fever of 100F or higher  FOLLOW UP: If any biopsies were taken you will be contacted by phone or by letter within the next 1-3 weeks.  Call your gastroenterologist if you have not heard about the biopsies in 3 weeks.  Our staff will call the home number listed on your records the next business day following your procedure to check on you and address any questions or concerns that you may have at that time regarding the information given to you following your procedure. This is a courtesy call and so if there is no answer at the home number and we have not heard from you through the emergency physician on call, we will assume that you have returned to your regular daily activities without incident.  SIGNATURES/CONFIDENTIALITY: You and/or your care partner have signed paperwork which will be entered into your electronic medical record.  These signatures attest to the fact that that the information above on your After Visit Summary has been reviewed and is understood.    Full responsibility of the confidentiality of this discharge information lies with you and/or your care-partner.  Read all of the handouts given to you by your recovery room nurse. 

## 2014-04-02 NOTE — Op Note (Signed)
[facility] [address] [place], [id number]   COLONOSCOPY PROCEDURE REPORT  PATIENT: Melissa ReddenSimmons, Melissa Cuevas  MR#: [id number] BIRTHDATE: [birth year] , 48  yrs. old GENDER: female ENDOSCOPIST: Roxy CedarJohn N Debbrah Sampedro Jr, MD REFERRED XB:[id number]:Surveillance Program Recall PROCEDURE DATE:  04/02/2014 PROCEDURE:   Colonoscopy, surveillance First Screening Colonoscopy - Avg.  risk and is 50 yrs.  old or older - No.  Prior Negative Screening - Now for repeat screening. N/A  History of Adenoma - Now for follow-up colonoscopy & has been > or = to 3 yrs.  Yes hx of adenoma.  Has been 3 or more years since last colonoscopy.  Polyps Removed Today? No.  Recommend repeat exam, <10 yrs? Yes.  High risk (family or personal hx). ASA CLASS:   Class II INDICATIONS:patient's immediate family history of colon cancer (mother, early 60s)and surveillance colonoscopy based on a history of adenomatous colonic polyp(s)-index exam 1999 negative. Followup exam 2009 multiple adenomatous. Most recent exam 2012-negative. MEDICATIONS: Monitored anesthesia care and Propofol 370 mg IV  DESCRIPTION OF PROCEDURE:   After the risks benefits and alternatives of the procedure were thoroughly explained, informed consent was obtained.  The digital rectal exam revealed no abnormalities of the rectum.   The LB [id number]  endoscope was introduced through the anus and advanced to the cecum, which was identified by both the appendix and ileocecal valve. No adverse events experienced.   The quality of the prep was good, using MoviPrep  The instrument was then slowly withdrawn as the colon was fully examined.      COLON FINDINGS: There was moderate diverticulosis noted in the right colon and left colon.   The examination was otherwise normal. No polyps seen.  Retroflexed views revealed no abnormalities. The time to cecum=3 minutes 31 seconds.  Withdrawal time=11 minutes 05 seconds.  The scope was withdrawn and the  procedure completed.  COMPLICATIONS: There were no immediate complications.  ENDOSCOPIC IMPRESSION: 1.   Moderate diverticulosis was noted in the right colon and left colon 2.   The examination was otherwise normal  RECOMMENDATIONS: 1. Follow up colonoscopy in 5 years (family history, personal history)  eSigned:  Roxy CedarJohn N Elbert Polyakov Jr, MD 04/02/2014 2:27 PM   cc: The Patient    ; Altamease Oileroelle Redmon,MD

## 2014-04-02 NOTE — Progress Notes (Signed)
Report to PACU, RN, vss, BBS= Clear.  

## 2014-04-02 NOTE — Telephone Encounter (Signed)
Called patient and no answer.  Left message on voicemail and asked patient to call me back at [phone].

## 2014-04-05 ENCOUNTER — Telehealth: Payer: Self-pay | Admitting: *Deleted

## 2014-04-05 NOTE — Telephone Encounter (Signed)
  Follow up Call-  Call back number 04/02/2014  Post procedure Call Back phone  # [phone]  Permission to leave phone message Yes     Patient questions:  Do you have a fever, pain , or abdominal swelling? No. Pain Score  0 *  Have you tolerated food without any problems? Yes.    Have you been able to return to your normal activities? Yes.    Do you have any questions about your discharge instructions: Diet   No. Medications  No. Follow up visit  No.  Do you have questions or concerns about your Care? No.  Actions: * If pain score is 4 or above: No action needed, pain <4.

## 2014-06-14 ENCOUNTER — Ambulatory Visit: Payer: 59 | Admitting: Family Medicine

## 2014-07-16 ENCOUNTER — Telehealth: Payer: Self-pay | Admitting: *Deleted

## 2014-07-16 ENCOUNTER — Ambulatory Visit: Payer: 59 | Admitting: Family Medicine

## 2014-07-16 NOTE — Telephone Encounter (Signed)
No fee, but please let her know if the future that there will be a fee and she needs to be on time

## 2014-07-16 NOTE — Telephone Encounter (Signed)
Pt called to let us know she got out of another appointment late and rescheduled for 10/29/14 to establish care.  Charge no show fee?

## 2014-07-16 NOTE — Telephone Encounter (Signed)
See note below

## 2014-07-19 LAB — HEMOGLOBIN A1C: Hgb A1c MFr Bld: 5.9 % (ref 4.0–6.0)

## 2014-10-08 ENCOUNTER — Ambulatory Visit: Payer: Self-pay | Admitting: Pharmacist

## 2014-10-12 ENCOUNTER — Telehealth: Payer: Self-pay | Admitting: Internal Medicine

## 2014-10-12 NOTE — Telephone Encounter (Signed)
Left message for pt to call back  °

## 2014-10-12 NOTE — Telephone Encounter (Signed)
Pt states she had some BRB from her rectum Friday and Saturday when BM's. Pt states it colored the toilet bowl water red. Pt concerned and would like to be seen. Pt scheduled to see Amy Esterwood PA 10/20/14@2pm . Pt aware of appt.

## 2014-10-20 ENCOUNTER — Ambulatory Visit: Payer: Self-pay | Admitting: Physician Assistant

## 2014-10-20 ENCOUNTER — Telehealth: Payer: Self-pay

## 2014-10-26 ENCOUNTER — Telehealth: Payer: Self-pay

## 2014-10-27 ENCOUNTER — Telehealth: Payer: Self-pay | Admitting: *Deleted

## 2014-10-27 NOTE — Telephone Encounter (Signed)
Unable to reach patient at time of Pre-Visit Call.  Left message for patient to return call when available.    

## 2014-10-28 ENCOUNTER — Other Ambulatory Visit: Payer: Self-pay | Admitting: Pharmacist

## 2014-10-28 ENCOUNTER — Encounter: Payer: Self-pay | Admitting: Pharmacist

## 2014-10-28 NOTE — Patient Outreach (Signed)
[facility]  [facility]   10/28/2014  Melissa Cuevas 02/22/1966 [id number]  Subjective: Melissa Cuevas is a 49 y.o. female [facility]. The patient arrives in good spirits but is tired as she just got off of work (she works nights).  She reports that her diabetes has been well controlled, especially since her dietary changes.  Her last A1c was in February 2016 and was 5.9. She will get a new A1c on Friday, May 13th, 2016. She was seeing Dr. Sherlyn Lickedmon but will be switching care to Dr. Beverely Lowabori tomorrow.   Patient does not regularly monitor her blood glucoses at home. She was sometimes check them, and when she does, all have been <120. She does not have her meter with her at this visit.   She reports adherence to her medications. She was taking metformin 1000 mg BID but recently started taking it once a day as the twice daily dosing made her feel like her blood glucose was getting too low. She reports making this change three weeks ago, and believes that this low feeling is due to her dietary changes.  She reports making significant dietary changes recently. She has noticed that it has made a difference in her blood glucose readings when she checks.   She denies exercising. She works overnight and has found it difficult to find time to exercise. However, she understands the importance of it in her attempts to lose weight.   Objective:   Current Medications: Current Outpatient Prescriptions  Medication Sig Dispense Refill  . amLODipine (NORVASC) 10 MG tablet Take 10 mg by mouth daily.    . fish oil-omega-3 fatty acids 1000 MG capsule Take 1 g by mouth daily.    Marland Kitchen. labetalol (NORMODYNE) 200 MG tablet Take 200 mg by mouth 2 (two) times daily.    . metFORMIN (GLUCOPHAGE) 1000 MG tablet Take 1,000 mg by mouth 2 (two) times daily with a meal.    . Prenatal Vit-Fe  Fumarate-FA (MULTIVITAMIN-PRENATAL) 27-0.8 MG TABS tablet Take 1 tablet by mouth daily at 12 noon.     No current facility-administered medications for this visit.    Assessment: 1. Diabetes: appears to be well controlled based on patient report of blood glucose readings. I think that the dietary changes have really helped her to improve her readings and her overall health. The dietary changes also may have resulted in her not needing as high of a dose of metformin. However, I am unable to truly assess due to lack of blood glucose readings. Her A1c tomorrow will help to determine if any changes need to be made in therapy.  2. Medication management: no issues noted  Plan: 1. Diabetes: no recommendations at this time - continue with current regimen. Patient to follow up with Dr. Beverely Lowabori tomorrow for further management and an updated A1c. Patient will continue metformin 1000 mg daily and will discuss changes with Dr. Beverely Lowabori tomorrow. Congratulated patient on healthy eating and encouraged patient to find time for exercise as this will aid in weight loss. Patient to follow up with pharmacy yearly for Link To Wellness management unless control worsens or other issues arise.  2. Medication management: patient to establish care with Dr. Beverely Lowabori tomorrow.      Juanita CraverStacey Karl, PharmD, BCPS Pharmacy Resident [facility] [phone]

## 2014-10-29 ENCOUNTER — Other Ambulatory Visit: Payer: 59

## 2014-10-29 ENCOUNTER — Encounter: Payer: Self-pay | Admitting: Family Medicine

## 2014-10-29 ENCOUNTER — Ambulatory Visit (INDEPENDENT_AMBULATORY_CARE_PROVIDER_SITE_OTHER): Payer: 59 | Admitting: Family Medicine

## 2014-10-29 VITALS — BP 154/92 | HR 87 | Temp 98.3°F | Resp 16 | Ht 64.75 in | Wt 247.2 lb

## 2014-10-29 DIAGNOSIS — I10 Essential (primary) hypertension: Secondary | ICD-10-CM | POA: Diagnosis not present

## 2014-10-29 DIAGNOSIS — D72818 Other decreased white blood cell count: Secondary | ICD-10-CM

## 2014-10-29 DIAGNOSIS — F329 Major depressive disorder, single episode, unspecified: Secondary | ICD-10-CM | POA: Insufficient documentation

## 2014-10-29 DIAGNOSIS — G4700 Insomnia, unspecified: Secondary | ICD-10-CM

## 2014-10-29 DIAGNOSIS — F32A Depression, unspecified: Secondary | ICD-10-CM | POA: Insufficient documentation

## 2014-10-29 DIAGNOSIS — E1169 Type 2 diabetes mellitus with other specified complication: Secondary | ICD-10-CM

## 2014-10-29 DIAGNOSIS — E785 Hyperlipidemia, unspecified: Secondary | ICD-10-CM | POA: Diagnosis not present

## 2014-10-29 DIAGNOSIS — E119 Type 2 diabetes mellitus without complications: Secondary | ICD-10-CM | POA: Diagnosis not present

## 2014-10-29 DIAGNOSIS — D729 Disorder of white blood cells, unspecified: Secondary | ICD-10-CM

## 2014-10-29 DIAGNOSIS — K439 Ventral hernia without obstruction or gangrene: Secondary | ICD-10-CM

## 2014-10-29 LAB — LIPID PANEL
CHOLESTEROL: 298 mg/dL — AB (ref 0–200)
HDL: 57.5 mg/dL (ref >39.00)
LDL Cholesterol: 219 mg/dL — ABNORMAL HIGH (ref 0–99)
NonHDL: 240.5
TRIGLYCERIDES: 110 mg/dL (ref 0.0–149.0)
Total CHOL/HDL Ratio: 5
VLDL: 22 mg/dL (ref 0.0–40.0)

## 2014-10-29 LAB — HEPATIC FUNCTION PANEL
ALT: 27 U/L (ref 0–35)
AST: 25 U/L (ref 0–37)
Albumin: 4.1 g/dL (ref 3.5–5.2)
Alkaline Phosphatase: 69 U/L (ref 39–117)
BILIRUBIN DIRECT: 0.1 mg/dL (ref 0.0–0.3)
TOTAL PROTEIN: 7.3 g/dL (ref 6.0–8.3)
Total Bilirubin: 0.3 mg/dL (ref 0.2–1.2)

## 2014-10-29 LAB — HEMOGLOBIN A1C: Hgb A1c MFr Bld: 5.9 % (ref 4.6–6.5)

## 2014-10-29 LAB — BASIC METABOLIC PANEL
BUN: 6 mg/dL (ref 6–23)
CALCIUM: 9.2 mg/dL (ref 8.4–10.5)
CHLORIDE: 101 meq/L (ref 96–112)
CO2: 30 mEq/L (ref 19–32)
Creatinine, Ser: 0.8 mg/dL (ref 0.40–1.20)
GFR: 97.38 mL/min (ref >60.00)
Glucose, Bld: 177 mg/dL — ABNORMAL HIGH (ref 70–99)
Potassium: 3.5 mEq/L (ref 3.5–5.1)
Sodium: 138 mEq/L (ref 135–145)

## 2014-10-29 LAB — TSH: TSH: 1.25 u[IU]/mL (ref 0.35–4.50)

## 2014-10-29 MED ORDER — LOSARTAN POTASSIUM 50 MG PO TABS: 50.0000 mg | ORAL_TABLET | Freq: Every day | ORAL | Status: DC

## 2014-10-29 MED ORDER — TRAZODONE HCL 50 MG PO TABS: 25.0000 mg | ORAL_TABLET | Freq: Every evening | ORAL | Status: DC | PRN

## 2014-10-29 NOTE — Progress Notes (Signed)
Pre visit review using our clinic review tool, if applicable. No additional management support is needed unless otherwise documented below in the visit note. 

## 2014-10-29 NOTE — Patient Instructions (Signed)
Follow up in 1 month to recheck sleep and BP We'll notify you of your lab results and make any changes if needed Keep up the good work on healthy diet- try to add regular exercise as a stress outlet Break the Amlodipine in half- take 5mg  daily START the Losartan 50mg - this will help BP and also protect your kidneys Use the trazodone for sleep- start w/ 1/2 tab We'll call you with your surgery appt for the hernia Call with any questions or concerns Hang in there! Welcome!  We're glad to have you!!!

## 2014-10-29 NOTE — Progress Notes (Signed)
   Subjective:    Patient ID: Melissa Cuevas, female    DOB: 03/13/1966, 49 y.o.   MRN: [id number]  HPI New to establish.  Previous MD- Deboraha SprangEagle, Noelle Redmon.  DM- chronic problem, on Metformin.  Last A1C 5.6 in Feb.  UTD on eye exam, due next month ([facility]).  Not on ACE/ARB but had microalbumin done in Feb and was told there was 'some spillage but less than before'.  Pt reports she is 'on a better eating program'.  Losing weight.  Denies symptomatic lows.  HTN- chronic problem.  BP is elevated today.  Works nights.  Has not taken meds yet today (Labetalol and Amlodipine).  Admits to depression due to divorce- 'i'm not well'.  Home BPs typically run 130-140s/70-80s.  No CP, SOB, HAs, visual changes, edema.  Depression- new for pt.  Going through a divorce.  Not interested in medication despite admitting that she is depressed.  'everything is stressful'.  Pt is asking for intermittent FMLA for depression and diabetes b/c mood impacts sugars.  Works as a Veterinary surgeoncounselor.  Poor sleep.  Hyperlipidemia- chronic problem, not currently on a statin.  Last LDL was 186, total 273.   Review of Systems For ROS see HPI     Objective:   Physical Exam  Constitutional: She is oriented to person, place, and time. She appears well-developed and well-nourished. No distress.  HENT:  Head: Normocephalic and atraumatic.  Eyes: Conjunctivae and EOM are normal. Pupils are equal, round, and reactive to light.  Neck: Normal range of motion. Neck supple. No thyromegaly present.  Cardiovascular: Normal rate, regular rhythm, normal heart sounds and intact distal pulses.   No murmur heard. Pulmonary/Chest: Effort normal and breath sounds normal. No respiratory distress.  Abdominal: Soft. She exhibits mass (reducible hernia just R and inferior to umbilicus). She exhibits no distension. There is no tenderness.  Musculoskeletal: She exhibits no edema.  Lymphadenopathy:    She has no cervical adenopathy.  Neurological:  She is alert and oriented to person, place, and time.  Skin: Skin is warm and dry.  Psychiatric: Her behavior is normal.  tearful  Vitals reviewed.         Assessment & Plan:

## 2014-10-30 LAB — MICROALBUMIN / CREATININE URINE RATIO
CREATININE, URINE: 58.8 mg/dL
MICROALB/CREAT RATIO: 413.3 mg/g — AB (ref 0.0–30.0)
Microalb, Ur: 24.3 mg/dL — ABNORMAL HIGH (ref <2.0)

## 2014-10-30 LAB — CBC WITH DIFFERENTIAL/PLATELET
BASOS ABS: 0.1 10*3/uL (ref 0.0–0.1)
Basophils Relative: 1 % (ref 0–1)
EOS ABS: 0.3 10*3/uL (ref 0.0–0.7)
Eosinophils Relative: 3 % (ref 0–5)
HCT: 30.4 % — ABNORMAL LOW (ref 36.0–46.0)
Hemoglobin: 10.4 g/dL — ABNORMAL LOW (ref 12.0–15.0)
Lymphocytes Relative: 29 % (ref 12–46)
Lymphs Abs: 2.8 10*3/uL (ref 0.7–4.0)
MCH: 24.4 pg — ABNORMAL LOW (ref 26.0–34.0)
MCHC: 34.2 g/dL (ref 30.0–36.0)
MCV: 71.2 fL — ABNORMAL LOW (ref 78.0–100.0)
MPV: 8.6 fL (ref 8.6–12.4)
Monocytes Absolute: 0.8 10*3/uL (ref 0.1–1.0)
Monocytes Relative: 8 % (ref 3–12)
Neutro Abs: 5.7 10*3/uL (ref 1.7–7.7)
Neutrophils Relative %: 59 % (ref 43–77)
PLATELETS: 431 10*3/uL — AB (ref 150–400)
RBC: 4.27 MIL/uL (ref 3.87–5.11)
RDW: 17 % — ABNORMAL HIGH (ref 11.5–15.5)
WBC: 9.7 10*3/uL (ref 4.0–10.5)

## 2014-10-31 NOTE — Assessment & Plan Note (Signed)
New to provider, ongoing for pt.  Last A1C showed good control but pt indicates she has proteinuria.  Is not on ACE/ARB but will start today and get baseline microalbumin.  UTD on eye exam.  Currently asymptomatic.  Check labs.  Adjust meds prn

## 2014-10-31 NOTE — Assessment & Plan Note (Addendum)
New to provider, ongoing for pt.  BP is not ideal but adequate.  Due to pt's report of spilling protein in the urine, will start ARB and decrease Amlodipine to 5mg .  Check baseline labs.  Pt expressed understanding and is in agreement w/ plan.

## 2014-10-31 NOTE — Assessment & Plan Note (Signed)
New.  Appears to be an extension of an umbilical hernia- just R and inferior to umbilicus.  No TTP.  Will refer for surgical consultation.  Pt expressed understanding and is in agreement w/ plan.

## 2014-10-31 NOTE — Assessment & Plan Note (Signed)
New.  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  Pt is not interested in starting medication at this time.  Denies SI/HI.  Asking for intermittent FMLA to use on the days when she is really struggling.  Told her this was something we could do for her.  Pt to drop off forms for completion.  Pt expressed understanding and is in agreement w/ plan.

## 2014-10-31 NOTE — Assessment & Plan Note (Signed)
New to provider but ongoing for pt according to last labs.  Not currently on a statin- not clear if there is a contraindication but pt doesn't report one.  Check labs and likely will need to start statin.  Pt expressed understanding and is in agreement w/ plan.

## 2014-10-31 NOTE — Assessment & Plan Note (Signed)
New.  Pt reports increased difficulty sleeping w/ her impending divorce.  Start Trazodone.  Monitor for improvement.

## 2014-11-01 ENCOUNTER — Other Ambulatory Visit: Payer: Self-pay | Admitting: General Practice

## 2014-11-01 ENCOUNTER — Telehealth: Payer: Self-pay | Admitting: *Deleted

## 2014-11-01 DIAGNOSIS — Z7689 Persons encountering health services in other specified circumstances: Secondary | ICD-10-CM

## 2014-11-01 MED ORDER — ROSUVASTATIN CALCIUM 20 MG PO TABS: 20.0000 mg | ORAL_TABLET | Freq: Every day | ORAL | Status: DC

## 2014-11-01 NOTE — Telephone Encounter (Signed)
FMLA paperwork received via fax from [employer]. Forms filled out as much as possible and forwarded to Dr. Beverely Lowabori. JG//CMA

## 2014-11-03 NOTE — Telephone Encounter (Signed)
Completed forms faxed to [employer] successfully. JG//CMA  

## 2014-11-12 ENCOUNTER — Encounter: Payer: Self-pay | Admitting: General Practice

## 2014-12-13 ENCOUNTER — Telehealth: Payer: Self-pay | Admitting: Family Medicine

## 2014-12-13 ENCOUNTER — Other Ambulatory Visit: Payer: Self-pay | Admitting: General Practice

## 2014-12-13 MED ORDER — ESZOPICLONE 2 MG PO TABS: 2.0000 mg | ORAL_TABLET | Freq: Every evening | ORAL | Status: DC | PRN

## 2014-12-13 NOTE — Telephone Encounter (Signed)
Med filled and pt notified that available at front desk for pick up.

## 2014-12-13 NOTE — Telephone Encounter (Signed)
Please advise, pt was last seen 10-29-14. Is Lunesta a good alternative?

## 2014-12-13 NOTE — Telephone Encounter (Signed)
Caller name: Sheyann Relation to pt: Call back number: [phone] Pharmacy: [facility]  Reason for call:   Patient would like to change to lunesta. She states that trazadone does not work well.

## 2014-12-13 NOTE — Telephone Encounter (Signed)
Ok for [facility]-MartLunesta 2mg  nightly, #30, 1 refill.  This is controlled substance and will need CSC

## 2014-12-14 ENCOUNTER — Ambulatory Visit: Payer: 59 | Admitting: Family Medicine

## 2014-12-31 ENCOUNTER — Ambulatory Visit (INDEPENDENT_AMBULATORY_CARE_PROVIDER_SITE_OTHER): Payer: 59 | Admitting: Family Medicine

## 2014-12-31 ENCOUNTER — Encounter: Payer: Self-pay | Admitting: Family Medicine

## 2014-12-31 ENCOUNTER — Encounter: Payer: Self-pay | Admitting: General Practice

## 2014-12-31 VITALS — BP 142/88 | HR 81 | Temp 98.0°F | Resp 16 | Ht 65.0 in | Wt 244.1 lb

## 2014-12-31 DIAGNOSIS — I10 Essential (primary) hypertension: Secondary | ICD-10-CM | POA: Diagnosis not present

## 2014-12-31 DIAGNOSIS — E1169 Type 2 diabetes mellitus with other specified complication: Secondary | ICD-10-CM

## 2014-12-31 DIAGNOSIS — E785 Hyperlipidemia, unspecified: Secondary | ICD-10-CM | POA: Diagnosis not present

## 2014-12-31 LAB — HEPATIC FUNCTION PANEL
ALBUMIN: 4.1 g/dL (ref 3.5–5.2)
ALT: 23 U/L (ref 0–35)
AST: 24 U/L (ref 0–37)
Alkaline Phosphatase: 68 U/L (ref 39–117)
BILIRUBIN TOTAL: 0.3 mg/dL (ref 0.2–1.2)
Bilirubin, Direct: 0.1 mg/dL (ref 0.0–0.3)
Total Protein: 7.1 g/dL (ref 6.0–8.3)

## 2014-12-31 LAB — BASIC METABOLIC PANEL
BUN: 11 mg/dL (ref 6–23)
CHLORIDE: 100 meq/L (ref 96–112)
CO2: 30 meq/L (ref 19–32)
Calcium: 9 mg/dL (ref 8.4–10.5)
Creatinine, Ser: 0.84 mg/dL (ref 0.40–1.20)
GFR: 91.98 mL/min (ref >60.00)
Glucose, Bld: 97 mg/dL (ref 70–99)
Potassium: 3.3 mEq/L — ABNORMAL LOW (ref 3.5–5.1)
Sodium: 137 mEq/L (ref 135–145)

## 2014-12-31 MED ORDER — LOSARTAN POTASSIUM 100 MG PO TABS: 100.0000 mg | ORAL_TABLET | Freq: Every day | ORAL | Status: DC

## 2014-12-31 NOTE — Patient Instructions (Signed)
Schedule your complete physical in 2-3 months We'll notify you of your lab results and make any changes if needed Drink plenty of water Continue to get regular exercise- I'm so proud of you!!! Increase the Losartan to - 2 of what you have and 1 of the new prescription Continue the Labetalol STOP the Amlodipine Continue the Crestor Call with any questions or concerns Have a great summer!!!

## 2014-12-31 NOTE — Progress Notes (Signed)
Pre visit review using our clinic review tool, if applicable. No additional management support is needed unless otherwise documented below in the visit note. 

## 2014-12-31 NOTE — Progress Notes (Signed)
   Subjective:    Patient ID: Melissa Cuevas, female    DOB: 10/20/1965, 49 y.o.   MRN: [id number]  HPI HTN- BP still not at goal.  Pt started Losartan as directed at time of last visit.  On Labetalol.  Not on Amlodipine.  Pt reports when she takes all 3 meds, BP 'really drops'.  No CP, SOB, HAs, visual changes, edema.  Hyperlipidemia- chronic problem, started on Crestor at last visit.  Due for LFTs.  No abd pain, N/V.   Review of Systems For ROS see HPI     Objective:   Physical Exam  Constitutional: She is oriented to person, place, and time. She appears well-developed and well-nourished. No distress.  HENT:  Head: Normocephalic and atraumatic.  Eyes: Conjunctivae and EOM are normal. Pupils are equal, round, and reactive to light.  Neck: Normal range of motion. Neck supple. No thyromegaly present.  Cardiovascular: Normal rate, regular rhythm, normal heart sounds and intact distal pulses.   No murmur heard. Pulmonary/Chest: Effort normal and breath sounds normal. No respiratory distress.  Abdominal: Soft. She exhibits no distension. There is no tenderness.  Musculoskeletal: She exhibits no edema.  Lymphadenopathy:    She has no cervical adenopathy.  Neurological: She is alert and oriented to person, place, and time.  Skin: Skin is warm and dry.  Psychiatric: She has a normal mood and affect. Her behavior is normal.  Vitals reviewed.         Assessment & Plan:

## 2015-01-01 NOTE — Assessment & Plan Note (Signed)
Chronic problem.  BP not quite at goal.  Will increase Losartan to  daily and take Amlodipine off her list since she is not taking anyway.  Check BMP.  Again stressed need for healthy diet and regular exercise.  Will follow

## 2015-01-01 NOTE — Assessment & Plan Note (Signed)
Pt was started on statin at last visit.  Tolerating w/o difficulty.  Check LFTs.

## 2015-01-19 LAB — HM DIABETES EYE EXAM

## 2015-01-20 LAB — HM DIABETES EYE EXAM

## 2015-02-01 ENCOUNTER — Ambulatory Visit (INDEPENDENT_AMBULATORY_CARE_PROVIDER_SITE_OTHER): Payer: Self-pay | Admitting: Family Medicine

## 2015-02-01 ENCOUNTER — Ambulatory Visit: Payer: Self-pay

## 2015-02-01 ENCOUNTER — Telehealth: Payer: Self-pay | Admitting: General Practice

## 2015-02-01 VITALS — BP 174/133 | HR 96 | Wt 241.0 lb

## 2015-02-01 DIAGNOSIS — E119 Type 2 diabetes mellitus without complications: Secondary | ICD-10-CM

## 2015-02-01 LAB — POCT GLYCOSYLATED HEMOGLOBIN (HGB A1C): HEMOGLOBIN A1C: 6.3

## 2015-02-01 NOTE — Telephone Encounter (Signed)
Pt returned call. Scheduled appt 02/03/15 9:30am.

## 2015-02-01 NOTE — Telephone Encounter (Signed)
-----   Message from Sheliah Hatch, MD sent at 02/01/2015  9:15 AM EDT ----- Please contact pt to schedule BP appt w/ me  KT ----- Message -----    From: Quentin Mulling, [facility]    Sent: 02/01/2015   8:50 AM      To: Sheliah Hatch, MD  I am a pharmacist seeing your patient for the [facility] link to wellness Diabetes program as a benefit for [employer] employees. Her A1C was at goal today. However, her BP was quite elevated at 174/133. She states compliance with BP meds but her refill history always indicates otherwise in particular with the labetalol (hasn't filled since April and it was for a 30 day supply). I would suggest possibly restarting her amlodipine so she can have 2 once daily bp meds (amlodipine and losartan) and possible stopping the labetalol since she doesn't seem to be taking it as directed anyway. Hopefully the 2 once daily meds will increase compliance. Also I wanted you to be aware she has not actually started her crestor. She never got it filled. I filled it today since it has gone generic and is free to patient. I counseled heavily on medication adherance and importance of blood pressure and lipid control. If you have any questions please give me a call at the pharmacy (MD line is [phone]). Please let me know if I can be any further assistance. I have encouraged patient to follow up with you sooner than her December physical with her blood pressure being so elevated.

## 2015-02-01 NOTE — Telephone Encounter (Signed)
Called pt and LMOVM to inform the need for her to have a BP follow up appt with Dr. Beverely Low.

## 2015-02-01 NOTE — Progress Notes (Signed)
Patient presents for diabetes follow up at part of the employee sponsored Link to [facility]. Medications have been reviewed. I have also discussed with patient lifestyle interventions such as diet and exericise.   Diabetes- POC A1C 6.3, at goal. Patient admits to sporadically taking metformin. She will take a half every couple days. She has began exercising everyday so at this point her blood sugar is under control although she did not bring her meter today. Encouraged continuing exercise. No recommended changes at this point.  Hypertension- blood pressure markedly elevated today at 174/133. This is a recurring issue. She states she is compliant with medications but her refill history always indicates otherwise. She has not filled her labetolol since April and it was for a 30 day supply. She states she still has some. Dr. Beverely Low had stopped her amlodipine. I will inbox dr. Beverely Low to let her know her blood pressure is elevated and she may need to restart the amlodipine and consider stopping labetalol since this is a twice daily medication and she may be more adherent to once daily losartan and once daily amlodipine. Counseled patient on importance of blood pressure control.  Hyperlipidemia-patient was started on crestor but never actually got it filled although she told her MD she was taking it. Crestor has since gone generic so it is free to patient. I filled it today and she picked it up and reiterated importance of taking especially with her extremely high LDL.   Patient has set a series of personal goals which include wt loss, increase exercise, blood pressure and lipid control. Follow up will be with Dr. Beverely Low prn for hypertension control and then in December for yearly physical. I will see patient after her MD visit.

## 2015-02-01 NOTE — Telephone Encounter (Signed)
Noted thanks °

## 2015-02-03 ENCOUNTER — Encounter: Payer: Self-pay | Admitting: Family Medicine

## 2015-02-03 ENCOUNTER — Ambulatory Visit (INDEPENDENT_AMBULATORY_CARE_PROVIDER_SITE_OTHER): Payer: 59 | Admitting: Family Medicine

## 2015-02-03 VITALS — BP 138/82 | HR 72 | Temp 98.5°F | Resp 16 | Wt 240.4 lb

## 2015-02-03 DIAGNOSIS — I10 Essential (primary) hypertension: Secondary | ICD-10-CM | POA: Diagnosis not present

## 2015-02-03 NOTE — Progress Notes (Signed)
   Subjective:    Patient ID: Melissa Cuevas, female    DOB: 1966/06/11, 49 y.o.   MRN: [id number]  HPI HTN- chronic problem.  Pt is in Healthy Living program and when pharmacist checked pt's BP it was 174/133.  Pt reports cuff was automated, too tight, she had not yet had meds and had come from the gym.  On Losartan and Labetalol.  Denies CP, SOB, HAs, visual changes, edema.  Pt reports good compliance on medication.   Review of Systems For ROS see HPI     Objective:   Physical Exam  Constitutional: She is oriented to person, place, and time. She appears well-developed and well-nourished. No distress.  obese  HENT:  Head: Normocephalic and atraumatic.  Eyes: Conjunctivae and EOM are normal. Pupils are equal, round, and reactive to light.  Neck: Normal range of motion. Neck supple. No thyromegaly present.  Cardiovascular: Normal rate, regular rhythm, normal heart sounds and intact distal pulses.   No murmur heard. Pulmonary/Chest: Effort normal and breath sounds normal. No respiratory distress.  Abdominal: Soft. She exhibits no distension. There is no tenderness.  Musculoskeletal: She exhibits no edema.  Lymphadenopathy:    She has no cervical adenopathy.  Neurological: She is alert and oriented to person, place, and time.  Skin: Skin is warm and dry.  Psychiatric: She has a normal mood and affect. Her behavior is normal.  Vitals reviewed.         Assessment & Plan:

## 2015-02-03 NOTE — Progress Notes (Signed)
Pre visit review using our clinic review tool, if applicable. No additional management support is needed unless otherwise documented below in the visit note. 

## 2015-02-03 NOTE — Assessment & Plan Note (Signed)
Chronic problem.  Pt reports good compliance w/ Labetalol and Losartan.  BP is within range today and pt is asymptomatic.  Pt prefers to remain on her Labetalol- despite the twice daily dosing- b/c she felt that the Amlodipine dropped her BP too low and she was dizzy.  Again discussed need for low salt diet, regular exercise.  Reviewed supportive care and red flags that should prompt return.  Pt expressed understanding and is in agreement w/ plan.

## 2015-02-03 NOTE — Patient Instructions (Signed)
Follow up as scheduled Continue to take your medication as directed- Losartan daily and Labetalol twice daily Drink plenty of fluids Limit your salt intake! Call with any questions or concerns Enjoy the rest of your summer!!!

## 2015-02-08 ENCOUNTER — Encounter: Payer: Self-pay | Admitting: General Practice

## 2015-02-23 NOTE — Progress Notes (Signed)
ATTENDING PHYSICIAN NOTE: I have reviewed the chart and agree with the plan as detailed above. Zeph Riebel MD Pager [phone]  

## 2015-03-14 ENCOUNTER — Telehealth: Payer: Self-pay | Admitting: *Deleted

## 2015-03-14 DIAGNOSIS — Z0279 Encounter for issue of other medical certificate: Secondary | ICD-10-CM

## 2015-03-14 NOTE — Telephone Encounter (Signed)
Pt dropped off FMLA forms needed for renewal for DM and depression. Filled out as much as possible and forwarded to Dr. Beverely Low. JG//CMA

## 2015-03-17 NOTE — Telephone Encounter (Signed)
Pt calling requesting to pick up the papers today, she said they are due tomorrow to continue her case. Ph# [phone].

## 2015-03-17 NOTE — Telephone Encounter (Signed)
Forms completed and placed on your desk.  Please remind pt that for future forms we need to allow 5-7 business days

## 2015-03-17 NOTE — Telephone Encounter (Signed)
Completed forms placed up front for pick up, left message for pt. Copy sent for scanning. JG//CMA

## 2015-03-24 ENCOUNTER — Other Ambulatory Visit: Payer: Self-pay

## 2015-03-24 DIAGNOSIS — Z1231 Encounter for screening mammogram for malignant neoplasm of breast: Secondary | ICD-10-CM

## 2015-03-25 ENCOUNTER — Telehealth: Payer: Self-pay | Admitting: Family Medicine

## 2015-03-25 NOTE — Telephone Encounter (Signed)
Relation to pt: self Call back number:[phone]  Pharmacy:  Reason for call: requesting a referral to [facility]. Patient insurance is [employer]

## 2015-03-28 NOTE — Telephone Encounter (Signed)
Referral sent to [facility] appt

## 2015-03-28 NOTE — Telephone Encounter (Signed)
Please advise. I see where a referral was done in May.

## 2015-03-28 NOTE — Telephone Encounter (Signed)
FYI

## 2015-03-28 NOTE — Telephone Encounter (Signed)
Yes- if referral was made, we can proceed w/ that referral.  I don't see where it was ever completed

## 2015-03-30 ENCOUNTER — Encounter: Payer: Self-pay | Admitting: Internal Medicine

## 2015-04-04 ENCOUNTER — Other Ambulatory Visit: Payer: Self-pay | Admitting: General Surgery

## 2015-04-08 ENCOUNTER — Ambulatory Visit
Admission: RE | Admit: 2015-04-08 | Discharge: 2015-04-08 | Disposition: A | Discharge status: Home / Self Care | Payer: 59 | Source: Ambulatory Visit

## 2015-04-08 DIAGNOSIS — Z1231 Encounter for screening mammogram for malignant neoplasm of breast: Secondary | ICD-10-CM

## 2015-06-01 ENCOUNTER — Other Ambulatory Visit: Payer: Self-pay | Admitting: General Practice

## 2015-06-01 MED ORDER — LABETALOL HCL 200 MG PO TABS: 200.0000 mg | ORAL_TABLET | Freq: Two times a day (BID) | ORAL | Status: DC

## 2015-06-01 NOTE — Telephone Encounter (Signed)
Do not show that you have ever filled this medication. Please advise.

## 2015-06-01 NOTE — Telephone Encounter (Signed)
Medication filled to pharmacy as requested.   

## 2015-06-07 ENCOUNTER — Encounter: Payer: Self-pay | Admitting: Family Medicine

## 2015-06-07 ENCOUNTER — Ambulatory Visit (INDEPENDENT_AMBULATORY_CARE_PROVIDER_SITE_OTHER): Payer: 59 | Admitting: Family Medicine

## 2015-06-07 VITALS — BP 120/80 | HR 82 | Temp 98.1°F | Resp 17 | Ht 65.0 in | Wt 238.0 lb

## 2015-06-07 DIAGNOSIS — Z01419 Encounter for gynecological examination (general) (routine) without abnormal findings: Secondary | ICD-10-CM | POA: Diagnosis not present

## 2015-06-07 DIAGNOSIS — R9431 Abnormal electrocardiogram [ECG] [EKG]: Secondary | ICD-10-CM | POA: Diagnosis not present

## 2015-06-07 DIAGNOSIS — E119 Type 2 diabetes mellitus without complications: Secondary | ICD-10-CM | POA: Diagnosis not present

## 2015-06-07 DIAGNOSIS — Z0000 Encounter for general adult medical examination without abnormal findings: Secondary | ICD-10-CM

## 2015-06-07 DIAGNOSIS — Z0001 Encounter for general adult medical examination with abnormal findings: Secondary | ICD-10-CM | POA: Diagnosis not present

## 2015-06-07 LAB — CBC WITH DIFFERENTIAL/PLATELET
Basophils Absolute: 0.1 10*3/uL (ref 0.0–0.1)
Basophils Relative: 1.1 % (ref 0.0–3.0)
EOS PCT: 4.7 % (ref 0.0–5.0)
Eosinophils Absolute: 0.3 10*3/uL (ref 0.0–0.7)
HCT: 32.2 % — ABNORMAL LOW (ref 36.0–46.0)
HEMOGLOBIN: 10.2 g/dL — AB (ref 12.0–15.0)
LYMPHS PCT: 38.8 % (ref 12.0–46.0)
Lymphs Abs: 2.3 10*3/uL (ref 0.7–4.0)
MCHC: 31.6 g/dL (ref 30.0–36.0)
MCV: 71.2 fl — ABNORMAL LOW (ref 78.0–100.0)
MONO ABS: 0.7 10*3/uL (ref 0.1–1.0)
MONOS PCT: 11.3 % (ref 3.0–12.0)
Neutro Abs: 2.6 10*3/uL (ref 1.4–7.7)
Neutrophils Relative %: 44.1 % (ref 43.0–77.0)
Platelets: 305 10*3/uL (ref 150.0–400.0)
RBC: 4.53 Mil/uL (ref 3.87–5.11)
RDW: 22.2 % — ABNORMAL HIGH (ref 11.5–15.5)
WBC: 5.9 10*3/uL (ref 4.0–10.5)

## 2015-06-07 LAB — HEMOGLOBIN A1C: Hgb A1c MFr Bld: 5.6 % (ref 4.6–6.5)

## 2015-06-07 LAB — BASIC METABOLIC PANEL
BUN: 14 mg/dL (ref 6–23)
CALCIUM: 9.2 mg/dL (ref 8.4–10.5)
CO2: 29 mEq/L (ref 19–32)
CREATININE: 0.86 mg/dL (ref 0.40–1.20)
Chloride: 101 mEq/L (ref 96–112)
GFR: 89.36 mL/min (ref >60.00)
Glucose, Bld: 101 mg/dL — ABNORMAL HIGH (ref 70–99)
Potassium: 3.3 mEq/L — ABNORMAL LOW (ref 3.5–5.1)
Sodium: 138 mEq/L (ref 135–145)

## 2015-06-07 LAB — LIPID PANEL
CHOL/HDL RATIO: 4
Cholesterol: 281 mg/dL — ABNORMAL HIGH (ref 0–200)
HDL: 65.6 mg/dL (ref >39.00)
LDL CALC: 198 mg/dL — AB (ref 0–99)
NonHDL: 215.77
TRIGLYCERIDES: 90 mg/dL (ref 0.0–149.0)
VLDL: 18 mg/dL (ref 0.0–40.0)

## 2015-06-07 LAB — HEPATIC FUNCTION PANEL
ALBUMIN: 4 g/dL (ref 3.5–5.2)
ALT: 19 U/L (ref 0–35)
AST: 19 U/L (ref 0–37)
Alkaline Phosphatase: 61 U/L (ref 39–117)
Bilirubin, Direct: 0 mg/dL (ref 0.0–0.3)
Total Bilirubin: 0.3 mg/dL (ref 0.2–1.2)
Total Protein: 7.1 g/dL (ref 6.0–8.3)

## 2015-06-07 LAB — TSH: TSH: 1.87 u[IU]/mL (ref 0.35–4.50)

## 2015-06-07 LAB — VITAMIN D 25 HYDROXY (VIT D DEFICIENCY, FRACTURES): VITD: 20.98 ng/mL — AB (ref 30.00–100.00)

## 2015-06-07 MED ORDER — LABETALOL HCL 200 MG PO TABS: 200.0000 mg | ORAL_TABLET | Freq: Two times a day (BID) | ORAL | Status: DC

## 2015-06-07 NOTE — Progress Notes (Signed)
   Subjective:    Patient ID: Melissa Cuevas, female    DOB: 11/16/1965, 49 y.o.   MRN: [id number]  HPI CPE- UTD on colonoscopy, mammo.  Due for pap (asking for referral to [facility])  UTD on flu.   Review of Systems Patient reports no vision/ hearing changes, adenopathy,fever, weight change,  persistant/recurrent hoarseness , swallowing issues, chest pain, edema, persistant/recurrent cough, hemoptysis, dyspnea (rest/exertional/paroxysmal nocturnal), gastrointestinal bleeding (melena, rectal bleeding), abdominal pain, significant heartburn, bowel changes, GU symptoms (dysuria, hematuria, incontinence), Gyn symptoms (abnormal  bleeding, pain),  syncope, focal weakness, memory loss, numbness & tingling, skin/hair/nail changes, abnormal bruising or bleeding, anxiety, or depression.   + intermittent palpitations    Objective:   Physical Exam General Appearance:    Alert, cooperative, no distress, appears stated age, obese  Head:    Normocephalic, without obvious abnormality, atraumatic  Eyes:    PERRL, conjunctiva/corneas clear, EOM's intact, fundi    benign, both eyes  Ears:    Normal TM's and external ear canals, both ears  Nose:   Nares normal, septum midline, mucosa normal, no drainage    or sinus tenderness  Throat:   Lips, mucosa, and tongue normal; teeth and gums normal  Neck:   Supple, symmetrical, trachea midline, no adenopathy;    Thyroid: no enlargement/tenderness/nodules  Back:     Symmetric, no curvature, ROM normal, no CVA tenderness  Lungs:     Clear to auscultation bilaterally, respirations unlabored  Chest Wall:    No tenderness or deformity   Heart:    Regular rate and rhythm, S1 and S2 normal, no murmur, rub   or gallop  Breast Exam:    Deferred to GYN  Abdomen:     Soft, non-tender, bowel sounds active all four quadrants,    no masses, no organomegaly  Genitalia:    Deferred to GYN  Rectal:    Extremities:   Extremities normal, atraumatic, no cyanosis or  edema  Pulses:   2+ and symmetric all extremities  Skin:   Skin color, texture, turgor normal, no rashes or lesions  Lymph nodes:   Cervical, supraclavicular, and axillary nodes normal  Neurologic:   CNII-XII intact, normal strength, sensation and reflexes    throughout          Assessment & Plan:

## 2015-06-07 NOTE — Patient Instructions (Signed)
Follow up in 3-4 months to recheck diabetes We'll notify you of your lab results and make any changes if needed Continue to work on healthy diet and regular exercise- you can do it! We'll call you with your GYN appt Call with any questions or concerns If you want to join us at the new [place] office, any scheduled appointments will automatically transfer and we will see you at [address], [place], [place] [id number] (OPENING 06/21/15) Happy Holidays!!!

## 2015-06-07 NOTE — Progress Notes (Signed)
Pre visit review using our clinic review tool, if applicable. No additional management support is needed unless otherwise documented below in the visit note. 

## 2015-06-10 ENCOUNTER — Other Ambulatory Visit: Payer: Self-pay | Admitting: General Practice

## 2015-06-10 MED ORDER — ROSUVASTATIN CALCIUM 20 MG PO TABS: 20.0000 mg | ORAL_TABLET | Freq: Every day | ORAL | Status: DC

## 2015-06-10 MED ORDER — VITAMIN D (ERGOCALCIFEROL) 1.25 MG (50000 UNIT) PO CAPS: 50000.0000 [IU] | ORAL_CAPSULE | ORAL | Status: DC

## 2015-06-26 NOTE — Assessment & Plan Note (Signed)
New.  Likely cause of pt's intermittent palpitations.  Refer to cards for complete evaluation and tx.  Pt expressed understanding and is in agreement w/ plan.

## 2015-06-26 NOTE — Assessment & Plan Note (Signed)
Pt's PE WNL.  Due for GYN- referral placed at pt's request.  UTD on mammo and colonoscopy.  Check labs.  Anticipatory guidance provided.

## 2015-06-26 NOTE — Assessment & Plan Note (Signed)
Chronic problem.  On ARB for renal protection.  Check labs.  Adjust meds prn.

## 2015-07-10 NOTE — Progress Notes (Signed)
Cardiology Office Note   Date:  07/11/2015   ID:  Melissa Cuevas, DOB [birth year], MRN [id number]  PCP:  Melissa Rhymes, MD  Cardiologist:   Melissa Hook, MD   Chief Complaint  Patient presents with  . Palpitations    swelling in feet and ankles. tiredness and fatigue after work. no chest pains, no shob.      History of Present Illness: Melissa Cuevas is a 50 y.o. female with hypertension, hyperlipidemia, and diabetes mellitus type 2 who presents for an evaluation of palpitations.  [name] was seen by her PCP, Dr. Neena Cuevas on 06/07/15.  At that appointment she had an EKG noted that her PR interval was short at 114 ms.   She reports having palpitations off and on for a while.  They only occur when she gians weight, is stressed, or when she doesn't sleep well.  She is currently going through a divorce, which has been stresful.  Each episode lasts for less than a minute and occurs once every few days.  When she is more stresed it occurs daily.  She denies lightheadedness, dizziness, shortness of breath or dizziness.  [name] rarely drinks coffee.  She does not drink sodas.  She notes that when she drinks coffee her palpitations increase.  [name] has noted increased lower extremity edema.  In the past she used furosemide, which helped.  The swelling is worse after working because she is on her feet all day.  Once she elevates her legs it improves.   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    She works out at [employer], treadmill and the elliptical.  She denies chest pain or shortness of breath with exercise.  She has been working out and trying to improve her diet.  She isn't sleeping and has been working a lot.   Past Medical History  Diagnosis Date  . Hypertension   . Diabetes mellitus   . Hyperlipidemia   . Obesity   . Sickle cell trait (HCC)   . Diverticulitis   . Palpitations 07/11/2015     Past Surgical History  Procedure Laterality Date  . Polypectomy    . Colonoscopy    . No prior surgery       Current Outpatient Prescriptions  Medication Sig Dispense Refill  . eszopiclone (LUNESTA) 2 MG TABS tablet Take 1 tablet (2 mg total) by mouth at bedtime as needed for sleep. Take immediately before bedtime 30 tablet 1  . fish oil-omega-3 fatty acids 1000 MG capsule Take 1 g by mouth daily.    . Fluocin-Hydroquinone-Tretinoin (TRI-LUMA) 0.01-4-0.05 % CREA Apply 1 application topically daily.    Marland Kitchen labetalol (NORMODYNE) 200 MG tablet Take 1 tablet (200 mg total) by mouth 2 (two) times daily. 60 tablet 6  . metFORMIN (GLUCOPHAGE) 1000 MG tablet Take 500 mg by mouth daily with breakfast.     . Multiple Vitamins-Minerals (MULTIVITAMIN ADULT PO) Take by mouth.    . rosuvastatin (CRESTOR) 20 MG tablet Take 1 tablet (20 mg total) by mouth daily. 90 tablet 1  . Vitamin D, Ergocalciferol, (DRISDOL) 50000 UNITS CAPS capsule Take 1 capsule (50,000 Units total) by mouth every 7 (seven) days. 12 capsule 0  . losartan-hydrochlorothiazide (HYZAAR) 100-12.5 MG tablet Take 1 tablet by mouth daily. 90 tablet 3   No current facility-administered medications for this visit.    Allergies:   Penicillins  Social History:  The patient  reports that she has never smoked. She has never used smokeless tobacco. She reports that she drinks alcohol. She reports that she does not use illicit drugs.   Family History:  The patient's family history includes Colon cancer (age of onset: 24) in her mother. There is no history of Stomach cancer.    ROS:  Please see the history of present illness.   Otherwise, review of systems are positive for none.   All other systems are reviewed and negative.    PHYSICAL EXAM: VS:  BP 152/68 mmHg  Pulse 88  Ht  (1.651 m)  Wt 107.502 kg (237 lb)  BMI 39.44 kg/m2 , BMI Body mass index is 39.44 kg/(m^2). GENERAL:  Well appearing HEENT:  Pupils equal round and  reactive, fundi not visualized, oral mucosa unremarkable NECK:  No jugular venous distention, waveform within normal limits, carotid upstroke brisk and symmetric, no bruits, no thyromegaly LYMPHATICS:  No cervical adenopathy LUNGS:  Clear to auscultation bilaterally HEART:  RRR.  PMI not displaced or sustained,S1 and S2 within normal limits, no S3, no S4, no clicks, no rubs, II/VI systolic murmur at the RUSB. ABD:  Flat, positive bowel sounds normal in frequency in pitch, no bruits, no rebound, no guarding, no midline pulsatile mass, no hepatomegaly, no splenomegaly EXT:  2 plus pulses throughout, 2+ pitting edema to the lower tibia bilaterally, no cyanosis no clubbing SKIN:  No rashes no nodules NEURO:  Cranial nerves II through XII grossly intact, motor grossly intact throughout PSYCH:  Cognitively intact, oriented to person place and time   EKG:  EKG is ordered today. The ekg ordered today demonstrates sinus rhythm.  Rate 80 bpm.   Recent Labs: 06/07/2015: ALT 19; BUN 14; Creatinine, Ser 0.86; Hemoglobin 10.2*; Platelets 305.0; Potassium 3.3*; Sodium 138; TSH 1.87    Lipid Panel    Component Value Date/Time   CHOL 281* 06/07/2015 0951   TRIG 90.0 06/07/2015 0951   HDL 65.60 06/07/2015 0951   CHOLHDL 4 06/07/2015 0951   VLDL 18.0 06/07/2015 0951   LDLCALC 198* 06/07/2015 0951      Wt Readings from Last 3 Encounters:  07/11/15 107.502 kg (237 lb)  06/07/15 107.956 kg (238 lb)  02/03/15 109.033 kg (240 lb 6 oz)      ASSESSMENT AND PLAN:  # Palpitations:  Her symptoms seem consistent with PACs or PVCs.  Her EKG today does not reveal any arrhythmias. We will obtain a 7 day event monitor to associate her symptoms with the underlying rhythm.  She already had the appropriate laboratory testing with Dr. Beverely Low.  Her potassium was slightly low.  Dr. Beverely Low instructed her increase her dietary increase of potassium.  We will check potassium at her next appointment and if it remains  low, we will start supplementation.   Her PR interval is normal today and there is no other evidence of WPW.  # Murmur: [name] has a systolic murmur on exam. Given this finding in her lower extremity edema, we will obtain an echo to evaluate for valvular heart disease and heart failure.  # Hypertension:  Blood pressure is above goal.Given that finding and her edema, we will switch losartan to a combination losartan /hydrochlorothiazide 12.5 mg.    # Hyperlipidemia:  Ms. Slavick was started on rosuvastatin in December for her hyperlipidemia.  We will repeat her lipids at her next appointment in 1 month.  Current medicines are reviewed at length with the patient  today.  The patient does not have concerns regarding medicines.  The following changes have been made:  Switch losartan to a combination losartan /hydrochlorothiazide 12.5 mg.     Labs/ tests ordered today include:   Orders Placed This Encounter  Procedures  . Cardiac event monitor  . EKG 12-Lead  . ECHOCARDIOGRAM COMPLETE     Disposition:   FU with Torien Ramroop C. Duke Salvia, MD, [facility] in 1 month.    This note was written with the assistance of speech recognition software.  Please excuse any transcriptional errors.  Signed, Jaasiel [place] C. Duke Salvia, MD, [facility]  07/11/2015 10:25 AM    [facility]

## 2015-07-11 ENCOUNTER — Ambulatory Visit (INDEPENDENT_AMBULATORY_CARE_PROVIDER_SITE_OTHER): Payer: 59 | Admitting: Cardiovascular Disease

## 2015-07-11 ENCOUNTER — Encounter: Payer: Self-pay | Admitting: Cardiovascular Disease

## 2015-07-11 VITALS — BP 152/68 | HR 88 | Ht 65.0 in | Wt 237.0 lb

## 2015-07-11 DIAGNOSIS — R002 Palpitations: Secondary | ICD-10-CM | POA: Insufficient documentation

## 2015-07-11 DIAGNOSIS — E785 Hyperlipidemia, unspecified: Secondary | ICD-10-CM

## 2015-07-11 DIAGNOSIS — I10 Essential (primary) hypertension: Secondary | ICD-10-CM

## 2015-07-11 DIAGNOSIS — R011 Cardiac murmur, unspecified: Secondary | ICD-10-CM | POA: Diagnosis not present

## 2015-07-11 HISTORY — DX: Palpitations: R00.2

## 2015-07-11 MED ORDER — LOSARTAN POTASSIUM-HCTZ 100-12.5 MG PO TABS: 1.0000 | ORAL_TABLET | Freq: Every day | ORAL | Status: DC

## 2015-07-11 MED FILL — LOSARTAN-HCTZ 100-12.5 MG T: 100-12.5 | 90 days supply | Qty: 90 | Fill #0

## 2015-07-11 NOTE — Patient Instructions (Signed)
Your physician has recommended that you wear an event monitor 7 DAYS. Event monitors are medical devices that record the heart's electrical activity. Doctors most often [place] these monitors to diagnose arrhythmias. Arrhythmias are problems with the speed or rhythm of the heartbeat. The monitor is a small, portable device. You can wear one while you do your normal daily activities. This is usually used to diagnose what is causing palpitations/syncope (passing out).  Your physician has requested that you have an echocardiogram [address]. Echocardiography is a painless test that uses sound waves to create images of your heart. It provides your doctor with information about the size and shape of your heart and how well your heart's chambers and valves are working. This procedure takes approximately one hour. There are no restrictions for this procedure.  STOP LOSARTAN  START LOSARTAN-HCT 20/12.5 MG ONE TABLET DAILY.   Your physician recommends that you schedule a follow-up appointment in 1 MONTH WITH DR [place].

## 2015-08-15 ENCOUNTER — Telehealth: Payer: Self-pay | Admitting: Family Medicine

## 2015-08-15 ENCOUNTER — Ambulatory Visit (INDEPENDENT_AMBULATORY_CARE_PROVIDER_SITE_OTHER): Payer: 59 | Admitting: Cardiovascular Disease

## 2015-08-15 ENCOUNTER — Encounter: Payer: Self-pay | Admitting: Cardiovascular Disease

## 2015-08-15 DIAGNOSIS — E1169 Type 2 diabetes mellitus with other specified complication: Secondary | ICD-10-CM

## 2015-08-15 DIAGNOSIS — E785 Hyperlipidemia, unspecified: Secondary | ICD-10-CM

## 2015-08-15 DIAGNOSIS — I10 Essential (primary) hypertension: Secondary | ICD-10-CM

## 2015-08-15 DIAGNOSIS — Z5181 Encounter for therapeutic drug level monitoring: Secondary | ICD-10-CM | POA: Diagnosis not present

## 2015-08-15 DIAGNOSIS — R002 Palpitations: Secondary | ICD-10-CM

## 2015-08-15 MED ORDER — LOSARTAN POTASSIUM-HCTZ 100-25 MG PO TABS: 1.0000 | ORAL_TABLET | Freq: Every day | ORAL | Status: DC

## 2015-08-15 MED FILL — LOSARTAN-HCTZ 100-25 MG TAB: 100-25 | 90 days supply | Qty: 90 | Fill #0

## 2015-08-15 NOTE — Progress Notes (Signed)
Cardiology Office Note   Date:  08/16/2015   ID:  Melissa Cuevas, DOB 1965-09-06, MRN [id number]  PCP:  Neena Rhymes, MD  Cardiologist:   Madilyn Hook, MD   Chief Complaint  Patient presents with  . Follow-up    no chest pain, swelling for years, no shortness of breath, no cramping, no dizziness or lightheadedness      Patient ID: Melissa Cuevas is a 50 y.o. female with hypertension, hyperlipidemia, and diabetes mellitus type 2 who presents for an follow up on palpitations.    Interval History 08/15/15: After her last appointment [name] wore a 7 day event monitor that did not reveal any abnormal rhythms.  She was referred for an echocardiogram but has not yet had this study.  Her blood pressure was slightly above goal so HCTZ was added to her regimen.  She has been feeling well and denies any chest pain.  She is very tired of working night shift.  She thinks that this has contributed to her weight gain.  She also sometimes has lower extremity edema after working.  She has been watching her sodium intake but does not wear compression stocking.  [name] reports feeling some palpitations while wearing the event monitor.  Lately it has been occurring less frequently.    History of Present Illness 07/11/15: [name] was seen by her PCP, Dr. Neena Rhymes on 06/07/15.  At that appointment she had an EKG noted that her PR interval was short at 114 ms.   She reports having palpitations off and on for a while.  They only occur when she gians weight, is stressed, or when she doesn't sleep well.  She is currently going through a divorce, which has been stresful.  Each episode lasts for less than a minute and occurs once every few days.  When she is more stresed it occurs daily.  She denies lightheadedness, dizziness, shortness of breath or dizziness.  [name] rarely drinks coffee.  She does not drink sodas.  She notes that when she drinks coffee her palpitations  increase.  [name] has noted increased lower extremity edema.  In the past she used furosemide, which helped.  The swelling is worse after working because she is on her feet all day.  Once she elevates her legs it improves.   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    She works out at [employer], treadmill and the elliptical.  She denies chest pain or shortness of breath with exercise.  She has been working out and trying to improve her diet.  She isn't sleeping and has been working a lot.   Past Medical History  Diagnosis Date  . Hypertension   . Diabetes mellitus   . Hyperlipidemia   . Obesity   . Sickle cell trait (HCC)   . Diverticulitis   . Palpitations 07/11/2015    Past Surgical History  Procedure Laterality Date  . Polypectomy    . Colonoscopy    . No prior surgery       Current Outpatient Prescriptions  Medication Sig Dispense Refill  . eszopiclone (LUNESTA) 2 MG TABS tablet Take 1 tablet (2 mg total) by mouth at bedtime as needed for sleep. Take immediately before bedtime 30 tablet 1  . fish oil-omega-3 fatty acids 1000 MG capsule Take 1 g by mouth daily.    . Fluocin-Hydroquinone-Tretinoin (TRI-LUMA) 0.01-4-0.05 % CREA Apply 1  application topically daily.    Marland Kitchen labetalol (NORMODYNE) 200 MG tablet Take 1 tablet (200 mg total) by mouth 2 (two) times daily. 60 tablet 6  . metFORMIN (GLUCOPHAGE) 1000 MG tablet Take 500 mg by mouth daily with breakfast.     . Multiple Vitamins-Minerals (MULTIVITAMIN ADULT PO) Take by mouth.    . rosuvastatin (CRESTOR) 20 MG tablet Take 1 tablet (20 mg total) by mouth daily. 90 tablet 1  . Vitamin D, Ergocalciferol, (DRISDOL) 50000 UNITS CAPS capsule Take 1 capsule (50,000 Units total) by mouth every 7 (seven) days. 12 capsule 0  . losartan-hydrochlorothiazide (HYZAAR) 100-25 MG tablet Take 1 tablet by mouth daily. 90 tablet 3   No current facility-administered medications for this  visit.    Allergies:   Penicillins    Social History:  The patient  reports that she has never smoked. She has never used smokeless tobacco. She reports that she drinks alcohol. She reports that she does not use illicit drugs.   Family History:  The patient's family history includes Alzheimer's disease in her maternal grandmother; Colon cancer (age of onset: 48) in her mother; Heart attack in her maternal grandmother; Prostate cancer in her father. There is no history of Stomach cancer.    ROS:  Please see the history of present illness.   Otherwise, review of systems are positive for none.   All other systems are reviewed and negative.    PHYSICAL EXAM: VS:  BP 150/88 mmHg  Pulse 72  Ht  (1.651 m)  Wt 107.956 kg (238 lb)  BMI 39.61 kg/m2  LMP 07/26/2015 , BMI Body mass index is 39.61 kg/(m^2). GENERAL:  Well appearing HEENT:  Pupils equal round and reactive, fundi not visualized, oral mucosa unremarkable NECK:  No jugular venous distention, waveform within normal limits, carotid upstroke brisk and symmetric, no bruits, no thyromegaly LYMPHATICS:  No cervical adenopathy LUNGS:  Clear to auscultation bilaterally HEART:  RRR.  PMI not displaced or sustained,S1 and S2 within normal limits, no S3, no S4, no clicks, no rubs, II/VI systolic murmur at the RUSB. ABD:  Flat, positive bowel sounds normal in frequency in pitch, no bruits, no rebound, no guarding, no midline pulsatile mass, no hepatomegaly, no splenomegaly EXT:  2 plus pulses throughout, 2+ pitting edema to the lower tibia bilaterally, no cyanosis no clubbing SKIN:  No rashes no nodules NEURO:  Cranial nerves II through XII grossly intact, motor grossly intact throughout PSYCH:  Cognitively intact, oriented to person place and time   EKG:  EKG is not ordered today.  7 Day Event Monitor 07/11/15:  Sinus rhythm. The patient reported skipped beats and sinus rhythm was noted.  Recent Labs: 06/07/2015: ALT 19; BUN 14;  Creatinine, Ser 0.86; Hemoglobin 10.2*; Platelets 305.0; Potassium 3.3*; Sodium 138; TSH 1.87    Lipid Panel    Component Value Date/Time   CHOL 281* 06/07/2015 0951   TRIG 90.0 06/07/2015 0951   HDL 65.60 06/07/2015 0951   CHOLHDL 4 06/07/2015 0951   VLDL 18.0 06/07/2015 0951   LDLCALC 198* 06/07/2015 0951      Wt Readings from Last 3 Encounters:  08/15/15 107.956 kg (238 lb)  07/11/15 107.502 kg (237 lb)  06/07/15 107.956 kg (238 lb)      ASSESSMENT AND PLAN:  # Palpitations:  Ms. Luepke did not have any underlying arrhythmias while wearing the Event monitor when she noted skipped beats.  This is unlikely to be coming from any dangerous arrhythmias.  She  has been hypokalemic in the past.  We will check a CMP today.  # Murmur: Ms. Pugmire has a systolic murmur on exam. Given this finding in her lower extremity edema, we will obtain an echo to evaluate for valvular heart disease and heart failure.  # Hypertension:  Blood pressure remains above goal.  We will increase her hyzaar to 100mg /25 mg daily and continue labetolol.  # Hyperlipidemia:  Ms. Deschepper was started on rosuvastatin in December for her hyperlipidemia. We will repeat her lipids and CMP.  # Morbid obesity: Ms. Bednarz is interested in bariatric clinic.  She was encouraged to explore options for weight loss.  She will call with any medication suggestions prior to starting appetite suppressing agents, as these could worsen her palpitations.   Current medicines are reviewed at length with the patient today.  The patient does not have concerns regarding medicines.  The following changes have been made:  Increase hyzaar as above   Labs/ tests ordered today include:   Orders Placed This Encounter  Procedures  . Lipid panel  . Comprehensive metabolic panel     Disposition:   FU with Onica Davidovich C. Duke Salvia, MD, [facility] in 3 months.    This note was written with the assistance of speech recognition software.  Please  excuse any transcriptional errors.  Signed, Kenly Henckel C. Duke Salvia, MD, [facility]  08/16/2015 5:19 PM    [facility]

## 2015-08-15 NOTE — Telephone Encounter (Signed)
Patient dropped off fmla paperwork  Put in Sharon's basket

## 2015-08-15 NOTE — Patient Instructions (Addendum)
You can get compression stockings in [place] at [employer] # [phone].   Medication Instructions: Dr Duke Salvia has recommended making the following medication changes: INCREASE Losartan-Hydrochlorothiazide (Hyzaar) to 100-25 mg  >>A new prescription has been sent to your pharmacy electronically  Labwork: Your physician recommends that you return for lab work at your earliest convenience - FASTING.  Testing/Procedures: NONE  Follow-up: Dr Duke Salvia recommends that you schedule a follow-up appointment in 3 months.  If you need a refill on your cardiac medications before your next appointment, please call your pharmacy.

## 2015-08-16 ENCOUNTER — Telehealth: Payer: Self-pay | Admitting: *Deleted

## 2015-08-16 ENCOUNTER — Encounter: Payer: Self-pay | Admitting: Cardiovascular Disease

## 2015-08-16 NOTE — Telephone Encounter (Addendum)
Melissa Cuevas at 08/15/2015 12:03 PM     Status: Signed       Expand All Collapse All   Patient dropped off fmla paperwork Put in Sharon's basket       Received FMLA paperwork, completed as much as possible; forwarded to provider/SLS 02/28

## 2015-08-22 ENCOUNTER — Telehealth: Payer: Self-pay

## 2015-08-23 ENCOUNTER — Telehealth: Payer: Self-pay | Admitting: *Deleted

## 2015-08-23 DIAGNOSIS — Z5181 Encounter for therapeutic drug level monitoring: Secondary | ICD-10-CM | POA: Diagnosis not present

## 2015-08-23 MED ORDER — METFORMIN HCL 1000 MG PO TABS: 500.0000 mg | ORAL_TABLET | Freq: Every day | ORAL | Status: DC

## 2015-08-23 MED FILL — metFORMIN HCL 1000 MG TABS: 1000 | 90 days supply | Qty: 45 | Fill #0

## 2015-08-23 NOTE — Telephone Encounter (Signed)
Rx request to pharmacy/SLS  

## 2015-08-24 ENCOUNTER — Telehealth: Payer: Self-pay | Admitting: Internal Medicine

## 2015-08-24 LAB — COMPREHENSIVE METABOLIC PANEL
ALBUMIN: 4.5 g/dL (ref 3.6–5.1)
ALT: 22 U/L (ref 6–29)
AST: 20 U/L (ref 10–35)
Alkaline Phosphatase: 64 U/L (ref 33–130)
BILIRUBIN TOTAL: 0.5 mg/dL (ref 0.2–1.2)
BUN: 10 mg/dL (ref 7–25)
CALCIUM: 9.5 mg/dL (ref 8.6–10.4)
CO2: 27 mmol/L (ref 20–31)
Chloride: 101 mmol/L (ref 98–110)
Creat: 0.94 mg/dL (ref 0.50–1.05)
GLUCOSE: 81 mg/dL (ref 65–99)
POTASSIUM: 3.8 mmol/L (ref 3.5–5.3)
Sodium: 140 mmol/L (ref 135–146)
Total Protein: 7.6 g/dL (ref 6.1–8.1)

## 2015-08-24 LAB — LIPID PANEL
CHOL/HDL RATIO: 3.8 ratio (ref <=5.0)
CHOLESTEROL: 314 mg/dL — AB (ref 125–200)
HDL: 83 mg/dL (ref >=46)
LDL Cholesterol: 211 mg/dL — ABNORMAL HIGH (ref <130)
TRIGLYCERIDES: 100 mg/dL (ref <150)
VLDL: 20 mg/dL (ref <30)

## 2015-08-24 MED FILL — LABETALOL HCL 200 MG TABLET: 200 | 90 days supply | Qty: 180 | Fill #0

## 2015-08-24 NOTE — Telephone Encounter (Signed)
It would be best if she comes into the office to discuss with me. Thanks

## 2015-08-24 NOTE — Telephone Encounter (Signed)
Spoke with pt and let her know Dr. Lamar SprinklesPerry's recommendations. Scheduled pt to see Dr. Marina GoodellPerry 09/27/15. Pt states she wants to cancel the appointment and just forget about scheduling colon for now. Dr. Marina GoodellPerry notified.

## 2015-08-24 NOTE — Telephone Encounter (Signed)
Incorrect. Family history is every 5 years, not yearly. No family history is every 10 years

## 2015-08-24 NOTE — Telephone Encounter (Signed)
She knows that 5 year recall is recommended but she wants to go ahead and have colon done. States she called insurance and was told she could have colon yearly. Please advise.

## 2015-08-24 NOTE — Telephone Encounter (Signed)
Pt states she has a family history of colon cancer and she contacted her insurance company and states she can have a colon yearly. Last colon in 2015, pt in for a 5 year recall but wants to go ahead and have colon done now. Please advise.

## 2015-08-25 ENCOUNTER — Telehealth: Payer: Self-pay | Admitting: *Deleted

## 2015-08-25 DIAGNOSIS — E785 Hyperlipidemia, unspecified: Principal | ICD-10-CM

## 2015-08-25 DIAGNOSIS — Z79899 Other long term (current) drug therapy: Secondary | ICD-10-CM

## 2015-08-25 DIAGNOSIS — E1169 Type 2 diabetes mellitus with other specified complication: Secondary | ICD-10-CM

## 2015-08-25 NOTE — Telephone Encounter (Signed)
Patient has only been taking her Crestor 3 times a week at most Advised patient to start taking daily and recheck labs in 6 weeks, verbalized understanding  Mailed lab forms

## 2015-08-25 NOTE — Telephone Encounter (Signed)
-----   Message from Melissa Siiffany [place], MD sent at 08/25/2015  9:07 AM EST ----- Cholesterol levels are worse than before she started taking crestor.  Is she taking the medication regularly?  If so, increase to 40 mg and repeat labs in 6 weeks.  If she is not taking it, continue current dose and repeat in 6 weeks.

## 2015-08-26 ENCOUNTER — Telehealth: Payer: Self-pay | Admitting: Cardiovascular Disease

## 2015-08-26 NOTE — Telephone Encounter (Signed)
New message      Talk to the nurse about a presc Dr Duke Salviaandolph want pt to take.

## 2015-08-26 NOTE — Telephone Encounter (Signed)
Spoke with pt, she is wanting to go to the bariatric weight loss center on [address]. The medication they use is PHENTERMINE. She wants to know if dr Duke Salviarandolph is okay with this medication. Will forward to dr Duke Salviarandolph

## 2015-08-29 NOTE — Telephone Encounter (Signed)
It is very likely that this medication will worsen her palpitations.  She can try it, but if she notices increased palpitations that is likely the cause.

## 2015-08-29 NOTE — Telephone Encounter (Signed)
Left message to call back  

## 2015-08-30 NOTE — Telephone Encounter (Signed)
Returning your call from yesterday. °

## 2015-08-30 NOTE — Telephone Encounter (Signed)
Advised patient of recommendations.  

## 2015-08-31 DIAGNOSIS — H524 Presbyopia: Secondary | ICD-10-CM | POA: Diagnosis not present

## 2015-08-31 DIAGNOSIS — H5213 Myopia, bilateral: Secondary | ICD-10-CM | POA: Diagnosis not present

## 2015-08-31 DIAGNOSIS — H52223 Regular astigmatism, bilateral: Secondary | ICD-10-CM | POA: Diagnosis not present

## 2015-09-02 LAB — HM DIABETES EYE EXAM

## 2015-09-05 ENCOUNTER — Ambulatory Visit: Payer: 59 | Admitting: Family Medicine

## 2015-09-06 DIAGNOSIS — L810 Postinflammatory hyperpigmentation: Secondary | ICD-10-CM | POA: Diagnosis not present

## 2015-09-06 DIAGNOSIS — L700 Acne vulgaris: Secondary | ICD-10-CM | POA: Diagnosis not present

## 2015-09-12 ENCOUNTER — Ambulatory Visit: Payer: Self-pay | Admitting: Pharmacist

## 2015-09-16 ENCOUNTER — Ambulatory Visit (INDEPENDENT_AMBULATORY_CARE_PROVIDER_SITE_OTHER): Payer: 59 | Admitting: Family Medicine

## 2015-09-16 ENCOUNTER — Encounter: Payer: Self-pay | Admitting: Family Medicine

## 2015-09-16 VITALS — BP 138/80 | HR 80 | Temp 98.6°F | Resp 18 | Ht 65.0 in | Wt 232.8 lb

## 2015-09-16 DIAGNOSIS — E119 Type 2 diabetes mellitus without complications: Secondary | ICD-10-CM

## 2015-09-16 LAB — BASIC METABOLIC PANEL
BUN: 13 mg/dL (ref 6–23)
CALCIUM: 9.5 mg/dL (ref 8.4–10.5)
CHLORIDE: 101 meq/L (ref 96–112)
CO2: 27 meq/L (ref 19–32)
CREATININE: 1.01 mg/dL (ref 0.40–1.20)
GFR: 74.15 mL/min (ref >60.00)
GLUCOSE: 85 mg/dL (ref 70–99)
Potassium: 3.7 mEq/L (ref 3.5–5.1)
Sodium: 138 mEq/L (ref 135–145)

## 2015-09-16 LAB — HEMOGLOBIN A1C: Hgb A1c MFr Bld: 6 % (ref 4.6–6.5)

## 2015-09-16 MED ORDER — METFORMIN HCL 1000 MG PO TABS: 1000.0000 mg | ORAL_TABLET | Freq: Every day | ORAL | Status: DC

## 2015-09-16 MED ORDER — LABETALOL HCL 200 MG PO TABS: 200.0000 mg | ORAL_TABLET | Freq: Two times a day (BID) | ORAL | Status: DC

## 2015-09-16 MED ORDER — ROSUVASTATIN CALCIUM 20 MG PO TABS: 20.0000 mg | ORAL_TABLET | Freq: Every day | ORAL | Status: DC

## 2015-09-16 MED ORDER — LOSARTAN POTASSIUM-HCTZ 100-25 MG PO TABS: 1.0000 | ORAL_TABLET | Freq: Every day | ORAL | Status: DC

## 2015-09-16 NOTE — Patient Instructions (Signed)
Follow up in 3-4 months to recheck diabetes, cholesterol, BP and get pap We'll notify you of your lab results and make any changes if needed Keep up the good work on healthy diet and regular exercise- you look great! Call with any questions or concerns Thanks for sticking with us! Happy Spring!!!

## 2015-09-16 NOTE — Assessment & Plan Note (Signed)
Chronic problem.  UTD on eye exam, foot exam done today.  On ARB for renal protection.  Denies symptomatic lows unless she increases Metformin to BID.  Encouraged her to work on [employer] diet and regular exercise.  Check labs.  Adjust meds prn

## 2015-09-16 NOTE — Progress Notes (Signed)
   Subjective:    Patient ID: Melissa Cuevas, female    DOB: 04/28/1966, 50 y.o.   MRN: [id number]  HPI DM- ongoing issue for pt.  On Metformin once daily- more than that causes sugars to drop quite low and her to feel bad.  On ARB for renal protection.  UTD on eye exam.  Due for foot exam.  Home CBGs ~90-100s.  Pt has lost 6 lbs since last visit.  Pt reports she has been off track on diet and exercise.  Denies symptomatic lows.  No CP, SOB, HAs, visual changes, edema.   Review of Systems For ROS see HPI     Objective:   Physical Exam  Constitutional: She is oriented to person, place, and time. She appears well-developed and well-nourished. No distress.  HENT:  Head: Normocephalic and atraumatic.  Eyes: Conjunctivae and EOM are normal. Pupils are equal, round, and reactive to light.  Neck: Normal range of motion. Neck supple. No thyromegaly present.  Cardiovascular: Normal rate, regular rhythm, normal heart sounds and intact distal pulses.   No murmur heard. Pulmonary/Chest: Effort normal and breath sounds normal. No respiratory distress.  Abdominal: Soft. She exhibits no distension. There is no tenderness.  Musculoskeletal: She exhibits no edema.  Lymphadenopathy:    She has no cervical adenopathy.  Neurological: She is alert and oriented to person, place, and time.  Skin: Skin is warm and dry.  Psychiatric: She has a normal mood and affect. Her behavior is normal.  Vitals reviewed.         Assessment & Plan:

## 2015-09-20 ENCOUNTER — Ambulatory Visit: Payer: Self-pay | Admitting: Internal Medicine

## 2015-11-04 MED FILL — TRI-LUMA CREAM: 0.01-4-0.05 | 30 days supply | Qty: 30 | Fill #0

## 2015-11-28 ENCOUNTER — Ambulatory Visit: Payer: Self-pay | Admitting: Cardiovascular Disease

## 2015-12-13 ENCOUNTER — Encounter: Payer: Self-pay | Admitting: General Practice

## 2015-12-13 ENCOUNTER — Other Ambulatory Visit (HOSPITAL_COMMUNITY)
Admission: RE | Admit: 2015-12-13 | Discharge: 2015-12-13 | Disposition: A | Discharge status: Home / Self Care | Payer: 59 | Source: Ambulatory Visit | Attending: Family Medicine | Admitting: Family Medicine

## 2015-12-13 ENCOUNTER — Ambulatory Visit (INDEPENDENT_AMBULATORY_CARE_PROVIDER_SITE_OTHER): Payer: Self-pay | Admitting: Family Medicine

## 2015-12-13 ENCOUNTER — Encounter: Payer: Self-pay | Admitting: Family Medicine

## 2015-12-13 VITALS — BP 124/84 | HR 84 | Temp 98.4°F | Resp 16 | Ht 65.0 in | Wt 235.0 lb

## 2015-12-13 DIAGNOSIS — E785 Hyperlipidemia, unspecified: Secondary | ICD-10-CM

## 2015-12-13 DIAGNOSIS — Z124 Encounter for screening for malignant neoplasm of cervix: Secondary | ICD-10-CM | POA: Diagnosis not present

## 2015-12-13 DIAGNOSIS — E119 Type 2 diabetes mellitus without complications: Secondary | ICD-10-CM | POA: Diagnosis not present

## 2015-12-13 DIAGNOSIS — Z1151 Encounter for screening for human papillomavirus (HPV): Secondary | ICD-10-CM | POA: Insufficient documentation

## 2015-12-13 DIAGNOSIS — N764 Abscess of vulva: Secondary | ICD-10-CM | POA: Insufficient documentation

## 2015-12-13 DIAGNOSIS — I10 Essential (primary) hypertension: Secondary | ICD-10-CM

## 2015-12-13 DIAGNOSIS — E1169 Type 2 diabetes mellitus with other specified complication: Secondary | ICD-10-CM

## 2015-12-13 DIAGNOSIS — Z01419 Encounter for gynecological examination (general) (routine) without abnormal findings: Secondary | ICD-10-CM | POA: Insufficient documentation

## 2015-12-13 LAB — HEMOGLOBIN A1C: HEMOGLOBIN A1C: 6.1 % (ref 4.6–6.5)

## 2015-12-13 LAB — BASIC METABOLIC PANEL
BUN: 14 mg/dL (ref 6–23)
CALCIUM: 9.1 mg/dL (ref 8.4–10.5)
CO2: 30 mEq/L (ref 19–32)
Chloride: 103 mEq/L (ref 96–112)
Creatinine, Ser: 0.92 mg/dL (ref 0.40–1.20)
GFR: 82.5 mL/min (ref >60.00)
Glucose, Bld: 121 mg/dL — ABNORMAL HIGH (ref 70–99)
POTASSIUM: 3.8 meq/L (ref 3.5–5.1)
SODIUM: 139 meq/L (ref 135–145)

## 2015-12-13 LAB — CBC WITH DIFFERENTIAL/PLATELET
BASOS ABS: 0 10*3/uL (ref 0.0–0.1)
Basophils Relative: 0.1 % (ref 0.0–3.0)
Eosinophils Absolute: 0.2 10*3/uL (ref 0.0–0.7)
Eosinophils Relative: 2.6 % (ref 0.0–5.0)
HCT: 28.1 % — ABNORMAL LOW (ref 36.0–46.0)
Hemoglobin: 8.7 g/dL — ABNORMAL LOW (ref 12.0–15.0)
LYMPHS ABS: 1.8 10*3/uL (ref 0.7–4.0)
Lymphocytes Relative: 22.8 % (ref 12.0–46.0)
MCHC: 31.2 g/dL (ref 30.0–36.0)
MONO ABS: 0.8 10*3/uL (ref 0.1–1.0)
MONOS PCT: 10.3 % (ref 3.0–12.0)
NEUTROS ABS: 5 10*3/uL (ref 1.4–7.7)
NEUTROS PCT: 64.2 % (ref 43.0–77.0)
PLATELETS: 278 10*3/uL (ref 150.0–400.0)
RBC: 4.33 Mil/uL (ref 3.87–5.11)
RDW: 19.4 % — ABNORMAL HIGH (ref 11.5–15.5)
WBC: 7.8 10*3/uL (ref 4.0–10.5)

## 2015-12-13 LAB — LIPID PANEL
Cholesterol: 300 mg/dL — ABNORMAL HIGH (ref 0–200)
HDL: 72.6 mg/dL (ref >39.00)
LDL CALC: 209 mg/dL — AB (ref 0–99)
NONHDL: 227.63
Total CHOL/HDL Ratio: 4
Triglycerides: 91 mg/dL (ref 0.0–149.0)
VLDL: 18.2 mg/dL (ref 0.0–40.0)

## 2015-12-13 LAB — TSH: TSH: 1.36 u[IU]/mL (ref 0.35–4.50)

## 2015-12-13 LAB — HEPATIC FUNCTION PANEL
ALBUMIN: 4.3 g/dL (ref 3.5–5.2)
ALK PHOS: 52 U/L (ref 39–117)
ALT: 13 U/L (ref 0–35)
AST: 15 U/L (ref 0–37)
BILIRUBIN TOTAL: 0.4 mg/dL (ref 0.2–1.2)
Bilirubin, Direct: 0 mg/dL (ref 0.0–0.3)
Total Protein: 7.2 g/dL (ref 6.0–8.3)

## 2015-12-13 MED ORDER — DOXYCYCLINE HYCLATE 100 MG PO TABS: 100.0000 mg | ORAL_TABLET | Freq: Two times a day (BID) | ORAL | Status: DC

## 2015-12-13 MED FILL — DOXYCYCLINE HYCLATE 100 MG: 100 | 10 days supply | Qty: 20 | Fill #0

## 2015-12-13 NOTE — Assessment & Plan Note (Signed)
Chronic problem.  Tolerating Metformin w/o difficulty.  On ARB for renal protection.  UTD on foot exam, eye exam.  Currently asymptomatic.  Check labs.  Adjust meds prn

## 2015-12-13 NOTE — Progress Notes (Signed)
   Subjective:    Patient ID: Melissa Cuevas, female    DOB: 01/06/1966, 50 y.o.   MRN: [id number]  HPI DM- chronic problem, on Metformin twice daily.  UTD on foot exam, due for eye exam in Aug.  On ARB for renal protection.  Pt reports increased carb intake lately.  No checking sugars at home.  Having some symptomatic lows due to erratic eating.  No numbness/tingling of hands/feet.  HTN- chronic problem, on Labetalol and Losartan- HCTZ w/ good control.  No CP, SOB, palpitations, HAs, visual changes, edema.  Hyperlipidemia- chronic problem, on Crestor and fish oil daily.  Denies abd pain, N/V.  Pap- pt due for repeat pap today  Boil- pt reports she has had painful swelling x2 week along L panty line.  Painful to sit.  Denies drainage.  No improvement w/ hot baths.   Review of Systems For ROS see HPI     Objective:   Physical Exam  Constitutional: She is oriented to person, place, and time. She appears well-developed and well-nourished. No distress.  HENT:  Head: Normocephalic and atraumatic.  Eyes: Conjunctivae and EOM are normal. Pupils are equal, round, and reactive to light.  Neck: Normal range of motion. Neck supple. No thyromegaly present.  Cardiovascular: Normal rate, regular rhythm, normal heart sounds and intact distal pulses.   No murmur heard. Pulmonary/Chest: Effort normal and breath sounds normal. No respiratory distress.  Abdominal: Soft. She exhibits no distension. There is no tenderness.  Genitourinary: Rectal exam shows no external hemorrhoid and no mass.    There is no rash, tenderness or lesion on the right labia. There is no rash, tenderness or lesion on the left labia. Uterus is not deviated, not enlarged, not fixed and not tender. Cervix exhibits no motion tenderness, no discharge and no friability. Right adnexum displays no mass, no tenderness and no fullness. Left adnexum displays no mass, no tenderness and no fullness. No erythema, tenderness or bleeding in  the vagina. No foreign body around the vagina. No signs of injury around the vagina. No vaginal discharge found.  Musculoskeletal: She exhibits no edema.  Lymphadenopathy:    She has no cervical adenopathy.  Neurological: She is alert and oriented to person, place, and time.  Skin: Skin is warm and dry.  Psychiatric: She has a normal mood and affect. Her behavior is normal.  Vitals reviewed.         Assessment & Plan:

## 2015-12-13 NOTE — Assessment & Plan Note (Signed)
New.  Area was prepped w/ alcohol and 2 upper areas were punctured w/ 23 gauge needle and copious pus was expressed.  Pt tolerated procedure w/o difficulty and had immediate relief.  Start Doxy.  Reviewed supportive care and red flags that should prompt return.  Pt expressed understanding and is in agreement w/ plan.

## 2015-12-13 NOTE — Assessment & Plan Note (Signed)
Chronic problem.  Well controlled.  Asymptomatic.  Check labs.  No anticipated med changes. 

## 2015-12-13 NOTE — Patient Instructions (Signed)
Follow up in 3-4 months to recheck diabetes We'll notify you of your lab results and make any changes if needed Start the Doxycycline twice daily- take w/ food (will make you sun sensitive and more likely to burn) Continue hot compresses or soaks to help the area drain Call with any questions or concerns Hang in there!!!!

## 2015-12-13 NOTE — Assessment & Plan Note (Signed)
Chronic problem.  Tolerating statin w/o difficulty.  Stressed the need for healthy diet and regular exercise.  Check labs.  Adjust meds prn  

## 2015-12-13 NOTE — Progress Notes (Signed)
Pre visit review using our clinic review tool, if applicable. No additional management support is needed unless otherwise documented below in the visit note. 

## 2015-12-14 ENCOUNTER — Other Ambulatory Visit: Payer: Self-pay | Admitting: General Practice

## 2015-12-14 ENCOUNTER — Encounter: Payer: Self-pay | Admitting: Family Medicine

## 2015-12-14 DIAGNOSIS — D649 Anemia, unspecified: Secondary | ICD-10-CM

## 2015-12-14 LAB — CYTOLOGY - PAP

## 2015-12-14 MED ORDER — FERROUS SULFATE 325 (65 FE) MG PO TABS: 325.0000 mg | ORAL_TABLET | Freq: Every day | ORAL | Status: DC

## 2015-12-16 ENCOUNTER — Other Ambulatory Visit: Payer: Self-pay | Admitting: General Practice

## 2015-12-16 DIAGNOSIS — B977 Papillomavirus as the cause of diseases classified elsewhere: Secondary | ICD-10-CM

## 2015-12-16 MED ORDER — METRONIDAZOLE 500 MG PO TABS: 500.0000 mg | ORAL_TABLET | Freq: Two times a day (BID) | ORAL | Status: DC

## 2015-12-16 MED FILL — metroNIDAZOLE 500 MG TABS: 500 | 7 days supply | Qty: 14 | Fill #0

## 2016-01-12 ENCOUNTER — Encounter: Payer: Self-pay | Admitting: Family Medicine

## 2016-01-23 MED FILL — metFORMIN HCL 1000 MG TABS: 1000 | 90 days supply | Qty: 90 | Fill #0

## 2016-05-01 MED FILL — ROSUVASTATIN CALCIUM 20 MG: 20 | 90 days supply | Qty: 90 | Fill #0

## 2016-05-01 MED FILL — metFORMIN HCL 1000 MG TABS: 1000 | 90 days supply | Qty: 90 | Fill #1

## 2016-08-08 ENCOUNTER — Ambulatory Visit (INDEPENDENT_AMBULATORY_CARE_PROVIDER_SITE_OTHER): Payer: Self-pay | Admitting: Family Medicine

## 2016-08-08 ENCOUNTER — Encounter: Payer: Self-pay | Admitting: Family Medicine

## 2016-08-08 VITALS — BP 121/84 | HR 79 | Temp 98.2°F | Resp 16 | Ht 65.0 in | Wt 232.1 lb

## 2016-08-08 DIAGNOSIS — Z23 Encounter for immunization: Secondary | ICD-10-CM

## 2016-08-08 DIAGNOSIS — E785 Hyperlipidemia, unspecified: Secondary | ICD-10-CM

## 2016-08-08 DIAGNOSIS — I10 Essential (primary) hypertension: Secondary | ICD-10-CM

## 2016-08-08 DIAGNOSIS — E119 Type 2 diabetes mellitus without complications: Secondary | ICD-10-CM

## 2016-08-08 DIAGNOSIS — E1169 Type 2 diabetes mellitus with other specified complication: Secondary | ICD-10-CM

## 2016-08-08 DIAGNOSIS — F5102 Adjustment insomnia: Secondary | ICD-10-CM

## 2016-08-08 LAB — CBC WITH DIFFERENTIAL/PLATELET
BASOS ABS: 52 {cells}/uL (ref 0–200)
Basophils Relative: 1 %
EOS ABS: 364 {cells}/uL (ref 15–500)
EOS PCT: 7 %
HCT: 35.1 % (ref 35.0–45.0)
Hemoglobin: 11.6 g/dL — ABNORMAL LOW (ref 11.7–15.5)
Lymphocytes Relative: 32 %
Lymphs Abs: 1664 cells/uL (ref 850–3900)
MCH: 25.8 pg — AB (ref 27.0–33.0)
MCHC: 33 g/dL (ref 32.0–36.0)
MCV: 78 fL — AB (ref 80.0–100.0)
MONOS PCT: 11 %
MPV: 8.6 fL (ref 7.5–12.5)
Monocytes Absolute: 572 cells/uL (ref 200–950)
NEUTROS PCT: 49 %
Neutro Abs: 2548 cells/uL (ref 1500–7800)
PLATELETS: 292 10*3/uL (ref 140–400)
RBC: 4.5 MIL/uL (ref 3.80–5.10)
RDW: 16.4 % — ABNORMAL HIGH (ref 11.0–15.0)
WBC: 5.2 10*3/uL (ref 3.8–10.8)

## 2016-08-08 LAB — HEPATIC FUNCTION PANEL
ALBUMIN: 4.2 g/dL (ref 3.6–5.1)
ALK PHOS: 72 U/L (ref 33–130)
ALT: 18 U/L (ref 6–29)
AST: 18 U/L (ref 10–35)
BILIRUBIN INDIRECT: 0.2 mg/dL (ref 0.2–1.2)
Bilirubin, Direct: 0.1 mg/dL (ref <=0.2)
TOTAL PROTEIN: 7.2 g/dL (ref 6.1–8.1)
Total Bilirubin: 0.3 mg/dL (ref 0.2–1.2)

## 2016-08-08 LAB — BASIC METABOLIC PANEL
BUN: 11 mg/dL (ref 7–25)
CHLORIDE: 103 mmol/L (ref 98–110)
CO2: 26 mmol/L (ref 20–31)
Calcium: 9.4 mg/dL (ref 8.6–10.4)
Creat: 0.91 mg/dL (ref 0.50–1.05)
GLUCOSE: 80 mg/dL (ref 65–99)
POTASSIUM: 3.9 mmol/L (ref 3.5–5.3)
SODIUM: 141 mmol/L (ref 135–146)

## 2016-08-08 LAB — LIPID PANEL
CHOLESTEROL: 171 mg/dL (ref <200)
HDL: 75 mg/dL (ref >50)
LDL Cholesterol: 80 mg/dL (ref <100)
Total CHOL/HDL Ratio: 2.3 Ratio (ref <5.0)
Triglycerides: 80 mg/dL (ref <150)
VLDL: 16 mg/dL (ref <30)

## 2016-08-08 LAB — HEMOGLOBIN A1C
HEMOGLOBIN A1C: 5.8 % — AB (ref <5.7)
Mean Plasma Glucose: 120 mg/dL

## 2016-08-08 LAB — TSH: TSH: 0.8 m[IU]/L

## 2016-08-08 MED ORDER — TRAZODONE HCL 50 MG PO TABS: 25.0000 mg | ORAL_TABLET | Freq: Every evening | ORAL | 3 refills | Status: AC | PRN

## 2016-08-08 NOTE — Assessment & Plan Note (Signed)
Chronic problem.  Tolerating statin w/o difficulty.  Check labs.  Adjust meds prn  

## 2016-08-08 NOTE — Progress Notes (Signed)
   Subjective:    Patient ID: Melissa Cuevas, female    DOB: 08/21/1965, 51 y.o.   MRN: [id number]  HPI DM- chronic problem, on Metformin.  On ARB for renal protection.  UTD on eye exam, foot exam.  Pt admits to 'a lot of stress eating.  A lot of carb eating'.  No CP, SOB, HAs, visual changes, edema.  Denies symptomatic lows unless skipping meals.  No numbness/tingling of hands/feet.  HTN- chronic problem, on Labetalol, Losartan-HCTZ w/ good control.  Hyperlipidemia- chronic problem, on Crestor daily.  No abd pain, N/V, myalgias.  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  Pt will sleep 3 hrs/night at most.     Review of Systems For ROS see HPI     Objective:   Physical Exam  Constitutional: She is oriented to person, place, and time. She appears well-developed and well-nourished. No distress.  HENT:  Head: Normocephalic and atraumatic.  Eyes: Conjunctivae and EOM are normal. Pupils are equal, round, and reactive to light.  Neck: Normal range of motion. Neck supple. No thyromegaly present.  Cardiovascular: Normal rate, regular rhythm, normal heart sounds and intact distal pulses.   No murmur heard. Pulmonary/Chest: Effort normal and breath sounds normal. No respiratory distress.  Abdominal: Soft. She exhibits no distension. There is no tenderness.  Musculoskeletal: She exhibits no edema.  Lymphadenopathy:    She has no cervical adenopathy.  Neurological: She is alert and oriented to person, place, and time.  Skin: Skin is warm and dry.  Psychiatric: She has a normal mood and affect. Her behavior is normal.  Vitals reviewed.         Assessment & Plan:

## 2016-08-08 NOTE — Patient Instructions (Addendum)
We'll notify you of your lab results and make any changes if needed Schedule an appt with a new doctor when you move Schedule your eye exam and mammogram when you get out there Continue to work on healthy diet and regular exercise- you can do it!!! Start the Trazodone nightly to help w/ sleep Call with any questions or concerns GOOD LUCK WITH THE MOVE!!!!

## 2016-08-08 NOTE — Assessment & Plan Note (Signed)
Chronic problem.  Well controlled today.  Asymptomatic.  Check labs.  No anticipated med changes.  Pt to follow up w/ new [place] in [place].

## 2016-08-08 NOTE — Assessment & Plan Note (Signed)
Chronic problem.  Pt admits to terrible diet and no exercise.  On ARB for renal protection.  Foot exam done today.  UTD on eye exam.  Stressed need for healthy diet and regular exercise.  Check labs.  Adjust meds prn

## 2016-08-08 NOTE — Assessment & Plan Note (Signed)
Deteriorated.  Start Trazodone nightly to improve sleep.  Pt expressed understanding and is in agreement w/ plan.

## 2016-08-08 NOTE — Progress Notes (Signed)
Pre visit review using our clinic review tool, if applicable. No additional management support is needed unless otherwise documented below in the visit note. 

## 2016-09-04 ENCOUNTER — Telehealth: Payer: Self-pay | Admitting: Family Medicine

## 2016-09-04 MED FILL — traZODone HCL 50 MG TABS: 50 | 30 days supply | Qty: 30 | Fill #0

## 2016-09-04 MED FILL — ROSUVASTATIN CALCIUM 20 MG: 20 | 90 days supply | Qty: 90 | Fill #1

## 2016-09-04 NOTE — Telephone Encounter (Signed)
Patient states she was seen on 08/08/16 and all of her meds should have been refilled.  However, when she contacted pharmacy they have not received any refills.  Patient states she is out of her meds and needs to pick them up today if possible.    Pharmacy:  [facility] [place] - [address] [phone] (Phone) [phone] (Fax)

## 2016-09-05 MED ORDER — ROSUVASTATIN CALCIUM 20 MG PO TABS: 20.0000 mg | ORAL_TABLET | Freq: Every day | ORAL | 1 refills | Status: AC

## 2016-09-05 MED ORDER — LOSARTAN POTASSIUM-HCTZ 100-25 MG PO TABS: 1.0000 | ORAL_TABLET | Freq: Every day | ORAL | 1 refills | Status: AC

## 2016-09-05 MED ORDER — METFORMIN HCL 1000 MG PO TABS: 1000.0000 mg | ORAL_TABLET | Freq: Every day | ORAL | 1 refills | Status: DC

## 2016-09-05 MED ORDER — LABETALOL HCL 200 MG PO TABS: 200.0000 mg | ORAL_TABLET | Freq: Two times a day (BID) | ORAL | 1 refills | Status: DC

## 2016-09-05 MED ORDER — FERROUS SULFATE 325 (65 FE) MG PO TABS: 325.0000 mg | ORAL_TABLET | Freq: Every day | ORAL | 1 refills | Status: AC

## 2016-09-05 MED FILL — LOSARTAN-HCTZ 100-25 MG TAB: 100-25 | 90 days supply | Qty: 90 | Fill #0

## 2016-09-05 MED FILL — LABETALOL HCL 200 MG TABLET: 200 | 90 days supply | Qty: 180 | Fill #0

## 2016-09-05 MED FILL — metFORMIN HCL 1000 MG TABS: 1000 | 90 days supply | Qty: 90 | Fill #0

## 2016-09-05 NOTE — Telephone Encounter (Signed)
Medication filled to pharmacy as requested.   

## 2017-01-07 MED FILL — LOSARTAN-HCTZ 100-25 MG TAB: 100-25 | 90 days supply | Qty: 90 | Fill #0

## 2017-01-07 MED FILL — ROSUVASTATIN CALCIUM 20 MG: 20 | 90 days supply | Qty: 90 | Fill #0

## 2017-01-07 MED FILL — LABETALOL HCL 200 MG TABLET: 200 | 90 days supply | Qty: 180 | Fill #1

## 2017-01-07 MED FILL — metFORMIN HCL 1000 MG TABS: 1000 | 90 days supply | Qty: 90 | Fill #1

## 2017-03-27 ENCOUNTER — Other Ambulatory Visit: Payer: Self-pay | Admitting: Family Medicine

## 2017-03-27 MED FILL — traZODone HCL 50 MG TABS: 50 | 30 days supply | Qty: 30 | Fill #1

## 2017-04-09 MED FILL — metFORMIN HCL 1000 MG TABS: 1000 | 30 days supply | Qty: 30 | Fill #0

## 2017-06-26 MED FILL — metFORMIN HCL 1000 MG TABS: 1000 | 30 days supply | Qty: 30 | Fill #1

## 2017-06-26 MED FILL — LOSARTAN-HCTZ 100-25 MG TAB: 100-25 | 30 days supply | Qty: 30 | Fill #1

## 2017-07-01 MED FILL — ROSUVASTATIN CALCIUM 20 MG: 20 | 30 days supply | Qty: 30 | Fill #1

## 2017-11-06 ENCOUNTER — Encounter: Payer: Self-pay | Admitting: General Practice

## 2019-05-01 ENCOUNTER — Encounter: Payer: Self-pay | Admitting: Internal Medicine

## 2024-01-03 ENCOUNTER — Encounter: Payer: Self-pay | Admitting: Advanced Practice Midwife
